# Patient Record
Sex: Female | Born: 1960 | Race: White | Hispanic: No | Marital: Married | Smoking: Never smoker
Health system: Southern US, Community
[De-identification: ages and names within clinical notes are randomized; demographics above are authoritative.]

## PROBLEM LIST (undated history)

## (undated) DIAGNOSIS — I10 Essential (primary) hypertension: Secondary | ICD-10-CM

## (undated) DIAGNOSIS — Z9889 Other specified postprocedural states: Secondary | ICD-10-CM

## (undated) DIAGNOSIS — R7303 Prediabetes: Secondary | ICD-10-CM

## (undated) DIAGNOSIS — G4730 Sleep apnea, unspecified: Secondary | ICD-10-CM

## (undated) HISTORY — PX: ABDOMINAL HYSTERECTOMY: SHX81

## (undated) HISTORY — PX: COMBINED ABDOMINOPLASTY AND LIPOSUCTION: SUR284

## (undated) HISTORY — PX: BREAST EXCISIONAL BIOPSY: SUR124

## (undated) HISTORY — PX: SHOULDER SURGERY: SHX246

## (undated) HISTORY — PX: BREAST ENHANCEMENT SURGERY: SHX7

## (undated) HISTORY — PX: ANKLE SURGERY: SHX546

---

## 2000-03-01 ENCOUNTER — Emergency Department (HOSPITAL_COMMUNITY): Admission: EM | Admit: 2000-03-01 | Discharge: 2000-03-01 | Payer: Self-pay

## 2004-12-08 ENCOUNTER — Other Ambulatory Visit: Admission: RE | Admit: 2004-12-08 | Discharge: 2004-12-08 | Payer: Self-pay | Admitting: *Deleted

## 2004-12-25 ENCOUNTER — Encounter: Admission: RE | Admit: 2004-12-25 | Discharge: 2004-12-25 | Payer: Self-pay | Admitting: Family Medicine

## 2005-01-27 ENCOUNTER — Ambulatory Visit: Payer: Self-pay | Admitting: Oncology

## 2005-03-26 ENCOUNTER — Ambulatory Visit: Payer: Self-pay | Admitting: Oncology

## 2005-05-21 ENCOUNTER — Ambulatory Visit: Payer: Self-pay | Admitting: Oncology

## 2005-07-16 ENCOUNTER — Ambulatory Visit: Payer: Self-pay | Admitting: Oncology

## 2006-08-31 ENCOUNTER — Encounter: Admission: RE | Admit: 2006-08-31 | Discharge: 2006-08-31 | Payer: Self-pay | Admitting: Orthopedic Surgery

## 2007-08-10 ENCOUNTER — Encounter: Admission: RE | Admit: 2007-08-10 | Discharge: 2007-08-10 | Payer: Self-pay | Admitting: Family Medicine

## 2008-11-06 ENCOUNTER — Encounter: Admission: RE | Admit: 2008-11-06 | Discharge: 2008-11-06 | Payer: Self-pay | Admitting: Family Medicine

## 2008-11-12 ENCOUNTER — Encounter: Admission: RE | Admit: 2008-11-12 | Discharge: 2008-11-12 | Payer: Self-pay | Admitting: Family Medicine

## 2009-10-11 HISTORY — PX: AUGMENTATION MAMMAPLASTY: SUR837

## 2009-11-07 ENCOUNTER — Encounter: Admission: RE | Admit: 2009-11-07 | Discharge: 2009-11-07 | Payer: Self-pay | Admitting: Family Medicine

## 2010-01-15 ENCOUNTER — Encounter: Admission: RE | Admit: 2010-01-15 | Discharge: 2010-01-15 | Payer: Self-pay | Admitting: Family Medicine

## 2010-10-31 ENCOUNTER — Encounter: Payer: Self-pay | Admitting: Family Medicine

## 2010-10-31 ENCOUNTER — Other Ambulatory Visit: Payer: Self-pay | Admitting: Family Medicine

## 2010-10-31 DIAGNOSIS — Z1239 Encounter for other screening for malignant neoplasm of breast: Secondary | ICD-10-CM

## 2010-11-13 ENCOUNTER — Ambulatory Visit: Payer: Self-pay

## 2010-11-17 ENCOUNTER — Ambulatory Visit
Admission: RE | Admit: 2010-11-17 | Discharge: 2010-11-17 | Disposition: A | Discharge status: Home / Self Care | Payer: Commercial Indemnity | Source: Ambulatory Visit | Attending: Family Medicine | Admitting: Family Medicine

## 2010-11-17 DIAGNOSIS — Z1239 Encounter for other screening for malignant neoplasm of breast: Secondary | ICD-10-CM

## 2011-01-22 ENCOUNTER — Emergency Department (HOSPITAL_COMMUNITY)
Admission: EM | Admit: 2011-01-22 | Discharge: 2011-01-22 | Disposition: A | Discharge status: Home / Self Care | Payer: Commercial Indemnity | Attending: Podiatry | Admitting: Podiatry

## 2011-01-22 DIAGNOSIS — R21 Rash and other nonspecific skin eruption: Secondary | ICD-10-CM | POA: Insufficient documentation

## 2011-01-22 DIAGNOSIS — L2989 Other pruritus: Secondary | ICD-10-CM | POA: Insufficient documentation

## 2011-01-22 DIAGNOSIS — L298 Other pruritus: Secondary | ICD-10-CM | POA: Insufficient documentation

## 2011-01-22 DIAGNOSIS — E039 Hypothyroidism, unspecified: Secondary | ICD-10-CM | POA: Insufficient documentation

## 2011-01-22 DIAGNOSIS — T7840XA Allergy, unspecified, initial encounter: Secondary | ICD-10-CM | POA: Insufficient documentation

## 2011-01-22 DIAGNOSIS — I10 Essential (primary) hypertension: Secondary | ICD-10-CM | POA: Insufficient documentation

## 2011-04-09 ENCOUNTER — Emergency Department (HOSPITAL_COMMUNITY)
Admission: EM | Admit: 2011-04-09 | Discharge: 2011-04-09 | Disposition: A | Discharge status: Home / Self Care | Payer: Commercial Indemnity | Attending: Emergency Medicine | Admitting: Emergency Medicine

## 2011-04-09 DIAGNOSIS — E039 Hypothyroidism, unspecified: Secondary | ICD-10-CM | POA: Insufficient documentation

## 2011-04-09 DIAGNOSIS — IMO0001 Reserved for inherently not codable concepts without codable children: Secondary | ICD-10-CM | POA: Insufficient documentation

## 2011-04-09 LAB — URINALYSIS, ROUTINE W REFLEX MICROSCOPIC
Glucose, UA: NEGATIVE mg/dL
Leukocytes, UA: NEGATIVE
Nitrite: NEGATIVE
pH: 7.5 (ref 5.0–8.0)

## 2011-04-09 LAB — DIFFERENTIAL
Basophils Absolute: 0 10*3/uL (ref 0.0–0.1)
Eosinophils Absolute: 0.1 10*3/uL (ref 0.0–0.7)
Eosinophils Relative: 1 % (ref 0–5)
Lymphocytes Relative: 15 % (ref 12–46)
Lymphs Abs: 1.6 10*3/uL (ref 0.7–4.0)
Neutrophils Relative %: 78 % — ABNORMAL HIGH (ref 43–77)

## 2011-04-09 LAB — COMPREHENSIVE METABOLIC PANEL
ALT: 12 U/L (ref 0–35)
AST: 17 U/L (ref 0–37)
Albumin: 4 g/dL (ref 3.5–5.2)
Alkaline Phosphatase: 100 U/L (ref 39–117)
CO2: 26 mEq/L (ref 19–32)
Chloride: 98 mEq/L (ref 96–112)
Creatinine, Ser: 0.74 mg/dL (ref 0.50–1.10)
GFR calc non Af Amer: >60 mL/min (ref >60)
Potassium: 4 mEq/L (ref 3.5–5.1)
Total Bilirubin: 0.3 mg/dL (ref 0.3–1.2)

## 2011-04-09 LAB — CBC
MCV: 88.1 fL (ref 78.0–100.0)
Platelets: 355 10*3/uL (ref 150–400)
RBC: 4.21 MIL/uL (ref 3.87–5.11)
RDW: 12.6 % (ref 11.5–15.5)
WBC: 10.3 10*3/uL (ref 4.0–10.5)

## 2011-04-12 LAB — B. BURGDORFI ANTIBODIES: B burgdorferi Ab IgG+IgM: 0.2 {ISR}

## 2012-02-10 ENCOUNTER — Emergency Department (HOSPITAL_COMMUNITY)
Admission: EM | Admit: 2012-02-10 | Discharge: 2012-02-10 | Disposition: A | Discharge status: Home / Self Care | Payer: Commercial Indemnity | Attending: Emergency Medicine | Admitting: Emergency Medicine

## 2012-02-10 ENCOUNTER — Encounter (HOSPITAL_COMMUNITY): Payer: Self-pay | Admitting: *Deleted

## 2012-02-10 DIAGNOSIS — K529 Noninfective gastroenteritis and colitis, unspecified: Secondary | ICD-10-CM

## 2012-02-10 DIAGNOSIS — K5289 Other specified noninfective gastroenteritis and colitis: Secondary | ICD-10-CM | POA: Insufficient documentation

## 2012-02-10 LAB — COMPREHENSIVE METABOLIC PANEL
ALT: 14 U/L (ref 0–35)
AST: 17 U/L (ref 0–37)
Albumin: 4.3 g/dL (ref 3.5–5.2)
Alkaline Phosphatase: 98 U/L (ref 39–117)
BUN: 14 mg/dL (ref 6–23)
CO2: 24 mEq/L (ref 19–32)
Calcium: 9.7 mg/dL (ref 8.4–10.5)
Chloride: 99 mEq/L (ref 96–112)
Creatinine, Ser: 0.78 mg/dL (ref 0.50–1.10)
GFR calc Af Amer: >90 mL/min (ref >90)
GFR calc non Af Amer: >90 mL/min (ref >90)
Glucose, Bld: 118 mg/dL — ABNORMAL HIGH (ref 70–99)
Potassium: 3.9 mEq/L (ref 3.5–5.1)
Sodium: 136 mEq/L (ref 135–145)
Total Bilirubin: 0.3 mg/dL (ref 0.3–1.2)
Total Protein: 9 g/dL — ABNORMAL HIGH (ref 6.0–8.3)

## 2012-02-10 LAB — DIFFERENTIAL
Basophils Absolute: 0.1 10*3/uL (ref 0.0–0.1)
Basophils Relative: 1 % (ref 0–1)
Eosinophils Absolute: 0 10*3/uL (ref 0.0–0.7)
Eosinophils Relative: 0 % (ref 0–5)
Lymphocytes Relative: 24 % (ref 12–46)
Lymphs Abs: 2.9 10*3/uL (ref 0.7–4.0)
Monocytes Absolute: 0.7 10*3/uL (ref 0.1–1.0)
Monocytes Relative: 6 % (ref 3–12)
Neutro Abs: 8.6 10*3/uL — ABNORMAL HIGH (ref 1.7–7.7)
Neutrophils Relative %: 70 % (ref 43–77)

## 2012-02-10 LAB — URINALYSIS, ROUTINE W REFLEX MICROSCOPIC
Bilirubin Urine: NEGATIVE
Glucose, UA: NEGATIVE mg/dL
Hgb urine dipstick: NEGATIVE
Ketones, ur: NEGATIVE mg/dL
Leukocytes, UA: NEGATIVE
Nitrite: NEGATIVE
Protein, ur: NEGATIVE mg/dL
Specific Gravity, Urine: 1.026 (ref 1.005–1.030)
Urobilinogen, UA: 0.2 mg/dL (ref 0.0–1.0)
pH: 6 (ref 5.0–8.0)

## 2012-02-10 LAB — CBC
HCT: 41.6 % (ref 36.0–46.0)
Hemoglobin: 14 g/dL (ref 12.0–15.0)
MCH: 30.4 pg (ref 26.0–34.0)
MCHC: 33.7 g/dL (ref 30.0–36.0)
MCV: 90.2 fL (ref 78.0–100.0)
Platelets: 432 10*3/uL — ABNORMAL HIGH (ref 150–400)
RBC: 4.61 MIL/uL (ref 3.87–5.11)
RDW: 12.6 % (ref 11.5–15.5)
WBC: 12.3 10*3/uL — ABNORMAL HIGH (ref 4.0–10.5)

## 2012-02-10 LAB — LIPASE, BLOOD: Lipase: 67 U/L — ABNORMAL HIGH (ref 11–59)

## 2012-02-10 MED ORDER — LOPERAMIDE HCL 2 MG PO CAPS: 2.0000 mg | ORAL_CAPSULE | Freq: Four times a day (QID) | ORAL | Status: AC | PRN

## 2012-02-10 MED ORDER — ONDANSETRON HCL 4 MG/2ML IJ SOLN
4.0000 mg | Freq: Once | INTRAMUSCULAR | Status: AC
  Administered 2012-02-10: 4 mg via INTRAVENOUS
  Filled 2012-02-10: qty 2

## 2012-02-10 MED ORDER — LOPERAMIDE HCL 2 MG PO CAPS
4.0000 mg | ORAL_CAPSULE | Freq: Once | ORAL | Status: AC
  Administered 2012-02-10: 4 mg via ORAL
  Filled 2012-02-10: qty 2

## 2012-02-10 MED ORDER — SODIUM CHLORIDE 0.9 % IV BOLUS (SEPSIS)
1000.0000 mL | Freq: Once | INTRAVENOUS | Status: AC
  Administered 2012-02-10: 1000 mL via INTRAVENOUS

## 2012-02-10 MED ORDER — PROMETHAZINE HCL 25 MG PO TABS: 25.0000 mg | ORAL_TABLET | Freq: Four times a day (QID) | ORAL | Status: DC | PRN

## 2012-02-10 MED ORDER — SODIUM CHLORIDE 0.9 % IV SOLN
Freq: Once | INTRAVENOUS | Status: AC
  Administered 2012-02-10: 14:00:00 via INTRAVENOUS

## 2012-02-10 NOTE — ED Notes (Signed)
Chris, PA at bedside. 

## 2012-02-10 NOTE — ED Notes (Signed)
Pt states decrease in nausea.

## 2012-02-10 NOTE — Discharge Instructions (Signed)
Return here as needed for any worsening in your condition. Slowly increase your fluids. There was a very mild elevation of an enzyme called Lipase that is dealing with your pancreas but this was mild and at this time seems not to be the cause of your issue. It could be mild irritation of the pancreas contributing to this or a result of the GI illness. I would suggest a bland diet over the next 24-36 hours.

## 2012-02-10 NOTE — ED Notes (Signed)
Pt reports nausea, vomiting, diarrhea and lower central abdominal pain. Nausea started Monday afternoon and symptoms became progressively worse.  Pt reports she is unable to keep fluids down.

## 2012-02-10 NOTE — ED Provider Notes (Signed)
History     CSN: [id number]  Arrival date & time 02/10/12  1205   First MD Initiated Contact with Patient 02/10/12 1223      Chief Complaint  Patient presents with  . Nausea  . Emesis  . Diarrhea  . Abdominal Pain    (Consider location/radiation/quality/duration/timing/severity/associated sxs/prior treatment) HPI The patient presents to the ER with a 4 day history of N/V/D. The patient states that she has some cramping pain right before her diarrhea BM happens. The patient does not have pain in her abdomen otherwise. The patient denies CP, SOB, fever, cough, dizziness, back pain, dysuria, or altered mental status. The patient states that she has not been able to eat and drink since this started Monday night. She states that nothing seems to improve the symptoms.  History reviewed. No pertinent past medical history.  Past Surgical History  Procedure Date  . Abdominal hysterectomy   . Breast enhancement surgery   . Combined abdominoplasty and liposuction     No family history on file.  History  Substance Use Topics  . Smoking status: Never Smoker   . Smokeless tobacco: Not on file  . Alcohol Use: Yes    OB History    Grav Para Term Preterm Abortions TAB SAB Ect Mult Living                  Review of Systems All other systems negative except as documented in the HPI. All pertinent positives and negatives as reviewed in the HPI.  Allergies  Sulfa antibiotics  Home Medications   Current Outpatient Rx  Name Route Sig Dispense Refill  . VITAMIN D 1000 UNITS PO TABS Oral Take 2,000 Units by mouth daily. Pt takes 2 tabs of the 1000 mg    . MULTI-VITAMIN/MINERALS PO TABS Oral Take 1 tablet by mouth daily.    . VENLAFAXINE HCL ER 150 MG PO CP24 Oral Take 150 mg by mouth daily.      BP 132/72  Pulse 78  Temp(Src) 98 F (36.7 C) (Oral)  Resp 20  Ht 5\' 3"  (1.6 m)  Wt 165 lb (74.844 kg)  BMI 29.23 kg/m2  SpO2 99%  Physical Exam Physical Examination: General  appearance - alert, well appearing, and in no distress, oriented to person, place, and time and normal appearing weight Mental status - alert, oriented to person, place, and time, normal mood, behavior, speech, dress, motor activity, and thought processes Eyes - pupils equal and reactive, extraocular eye movements intact Ears - bilateral TM's and external ear canals normal Nose - normal and patent, no erythema, discharge or polyps Mouth - normal other than mucous membranes do appear somewhat dry. Lymphatics - no palpable lymphadenopathy, no hepatosplenomegaly Chest - clear to auscultation, no wheezes, rales or rhonchi, symmetric air entry, no tachypnea, retractions or cyanosis Heart - normal rate, regular rhythm, normal S1, S2, no murmurs, rubs, clicks or gallops Abdomen - soft, nontender, nondistended, no masses or organomegaly Skin - normal coloration and turgor, no rashes, no suspicious skin lesions noted  ED Course  Procedures (including critical care time)  Labs Reviewed  COMPREHENSIVE METABOLIC PANEL - Abnormal; Notable for the following:    Glucose, Bld 118 (*)    Total Protein 9.0 (*)    All other components within normal limits  CBC - Abnormal; Notable for the following:    WBC 12.3 (*)    Platelets 432 (*)    All other components within normal limits  DIFFERENTIAL - Abnormal;  Notable for the following:    Neutro Abs 8.6 (*)    All other components within normal limits  URINALYSIS, ROUTINE W REFLEX MICROSCOPIC - Abnormal; Notable for the following:    APPearance CLOUDY (*)    All other components within normal limits  LIPASE, BLOOD - Abnormal; Notable for the following:    Lipase 67 (*)    All other components within normal limits   Patient is rechecked x3 while here in the ER. The patient has a very mild elevation of her LIpase. She does not have abdominal pain. The patient is advised to follow up with her PCP for further eval. She has been able to tolerate oral fluids  here in the ER. I still believe that the patient has gastroenteritis. The patient is not a drinker. The patient is advised to return here as needed for any worsening in her condition. The patient is advised to follow a BRAT diet. With the absence of abdominal pain no imaging was ordered. The patient is given the results and plan. She voiced an understanding.    MDM  MDM Reviewed: nursing note and vitals Reviewed previous: labs Interpretation: labs            Carlyle Dolly, PA-C 02/10/12 1829

## 2012-02-12 NOTE — ED Provider Notes (Signed)
Medical screening examination/treatment/procedure(s) were performed by non-physician practitioner and as supervising physician I was immediately available for consultation/collaboration.    Malekai Markwood R Seichi Kaufhold, MD 02/12/12 1319 

## 2012-03-08 ENCOUNTER — Other Ambulatory Visit: Payer: Self-pay | Admitting: Family Medicine

## 2012-03-08 DIAGNOSIS — Z9882 Breast implant status: Secondary | ICD-10-CM

## 2012-03-08 DIAGNOSIS — Z1231 Encounter for screening mammogram for malignant neoplasm of breast: Secondary | ICD-10-CM

## 2012-03-23 ENCOUNTER — Encounter

## 2012-04-07 ENCOUNTER — Ambulatory Visit
Admission: RE | Admit: 2012-04-07 | Discharge: 2012-04-07 | Disposition: A | Discharge status: Home / Self Care | Payer: 59 | Source: Ambulatory Visit | Attending: Family Medicine | Admitting: Family Medicine

## 2012-04-07 DIAGNOSIS — Z9882 Breast implant status: Secondary | ICD-10-CM

## 2012-04-07 DIAGNOSIS — Z1231 Encounter for screening mammogram for malignant neoplasm of breast: Secondary | ICD-10-CM

## 2012-11-18 ENCOUNTER — Encounter (HOSPITAL_COMMUNITY): Payer: Self-pay | Admitting: Emergency Medicine

## 2012-11-18 ENCOUNTER — Emergency Department (HOSPITAL_COMMUNITY)
Admission: EM | Admit: 2012-11-18 | Discharge: 2012-11-18 | Disposition: A | Discharge status: Home / Self Care | Payer: 59 | Attending: Emergency Medicine | Admitting: Emergency Medicine

## 2012-11-18 DIAGNOSIS — N390 Urinary tract infection, site not specified: Secondary | ICD-10-CM | POA: Insufficient documentation

## 2012-11-18 DIAGNOSIS — R3915 Urgency of urination: Secondary | ICD-10-CM | POA: Insufficient documentation

## 2012-11-18 DIAGNOSIS — M545 Low back pain, unspecified: Secondary | ICD-10-CM | POA: Insufficient documentation

## 2012-11-18 DIAGNOSIS — Z79899 Other long term (current) drug therapy: Secondary | ICD-10-CM | POA: Insufficient documentation

## 2012-11-18 DIAGNOSIS — R319 Hematuria, unspecified: Secondary | ICD-10-CM | POA: Insufficient documentation

## 2012-11-18 LAB — URINALYSIS, ROUTINE W REFLEX MICROSCOPIC
Ketones, ur: NEGATIVE mg/dL
Nitrite: POSITIVE — AB
Specific Gravity, Urine: 1.015 (ref 1.005–1.030)
pH: 5 (ref 5.0–8.0)

## 2012-11-18 LAB — URINE MICROSCOPIC-ADD ON

## 2012-11-18 MED ORDER — NITROFURANTOIN MONOHYD MACRO 100 MG PO CAPS: 100.0000 mg | ORAL_CAPSULE | Freq: Two times a day (BID) | ORAL | Status: DC

## 2012-11-18 NOTE — ED Notes (Signed)
Pt c/o bilat low back pain onset 1 week, pt noted today increase in urination denies pain with urination, pt noted pink streaks after urination today. Denies fever.

## 2012-11-18 NOTE — ED Provider Notes (Signed)
History     This chart was scribed for non-physician practitioner working with Gerhard Munch, MD by Burman Nieves. This patient was seen in room WTR9/WTR9 and the patient's care was started at 9:27 PM.  CSN: [id number]  Arrival date & time 11/18/12  1842      Chief Complaint  Patient presents with  . Urinary Frequency    (Consider location/radiation/quality/duration/timing/severity/associated sxs/prior treatment) Patient is a 52 y.o. female presenting with frequency. The history is provided by the patient. No language interpreter was used.  Urinary Frequency This is a new problem. The current episode started more than 2 days ago. The problem occurs constantly. The problem has not changed since onset.Pertinent negatives include no abdominal pain and no shortness of breath. Nothing aggravates the symptoms. Nothing relieves the symptoms. The treatment provided no relief.   LANYAH SPENGLER is a 52 y.o. female who presents to the Emergency Department complaining of constant urinary frequency and urinary urgency  onset two days ago. She complained of a small amount of blood in her urine onset two days ago.  She has been having pain across her lower back for the past week.  She began having that pain after moving.  She denies dysuria. Pt took two ibuprofen for pain with no relief. Pt denies of fever, chills, cough, nausea, vomiting, diarrhea, SOB, weakness, and any other associated symptoms.  No prior history of kidney stones.     History reviewed. No pertinent past medical history.  Past Surgical History  Procedure Laterality Date  . Abdominal hysterectomy    . Breast enhancement surgery    . Combined abdominoplasty and liposuction      No family history on file.  History  Substance Use Topics  . Smoking status: Never Smoker   . Smokeless tobacco: Not on file  . Alcohol Use: Yes     Comment: occasional    OB History   Grav Para Term Preterm Abortions TAB SAB Ect Mult Living                   Review of Systems  Constitutional: Negative for fever and chills.  Respiratory: Negative for shortness of breath.   Gastrointestinal: Negative for nausea, vomiting, abdominal pain and abdominal distention.  Genitourinary: Positive for urgency, frequency and hematuria. Negative for dysuria, flank pain, vaginal bleeding, vaginal discharge and difficulty urinating.  Neurological: Negative for weakness.  All other systems reviewed and are negative.    Allergies  Sulfa antibiotics  Home Medications   Current Outpatient Rx  Name  Route  Sig  Dispense  Refill  . cholecalciferol (VITAMIN D) 1000 UNITS tablet   Oral   Take 2,000 Units by mouth every morning.          Marland Kitchen estradiol (VIVELLE-DOT) 0.1 MG/24HR   Transdermal   Place 1 patch onto the skin 2 (two) times a week. Replace patch on Wednesday and Sunday         . LEVOTHYROXINE SODIUM PO   Oral   Take 1 tablet by mouth every morning.         . Multiple Vitamins-Minerals (MULTIVITAMIN WITH MINERALS) tablet   Oral   Take 1 tablet by mouth every morning.          . venlafaxine XR (EFFEXOR-XR) 75 MG 24 hr capsule   Oral   Take 75 mg by mouth every morning.           BP 134/65  Pulse 98  Temp(Src) 98 F (  36.7 C) (Oral)  Resp 16  Ht 5\' 3"  (1.6 m)  Wt 168 lb (76.204 kg)  BMI 29.77 kg/m2  SpO2 100%  Physical Exam  Nursing note and vitals reviewed. Constitutional: She is oriented to person, place, and time. She appears well-developed and well-nourished. No distress.  HENT:  Head: Normocephalic and atraumatic.  Mouth/Throat: Oropharynx is clear and moist.  Eyes: EOM are normal. Pupils are equal, round, and reactive to light.  Neck: Normal range of motion. Neck supple. No tracheal deviation present.  Cardiovascular: Normal rate, regular rhythm and normal heart sounds.   Pulmonary/Chest: Effort normal and breath sounds normal. No respiratory distress.  Abdominal: Soft. Bowel sounds are normal. She  exhibits no distension and no mass. There is no tenderness. There is no rebound, no guarding and no CVA tenderness.  Musculoskeletal: Normal range of motion.  Neurological: She is alert and oriented to person, place, and time.  Skin: Skin is warm and dry.  Psychiatric: She has a normal mood and affect. Her behavior is normal.    ED Course  Procedures (including critical care time) DIAGNOSTIC STUDIES: Oxygen Saturation is 100% on room air, normal by my interpretation.    COORDINATION OF CARE:  9:28 PM Discussed ED treatment with pt and pt agrees.       Labs Reviewed  URINALYSIS, ROUTINE W REFLEX MICROSCOPIC - Abnormal; Notable for the following:    Color, Urine AMBER (*)    APPearance CLOUDY (*)    Hgb urine dipstick LARGE (*)    Protein, ur 30 (*)    Nitrite POSITIVE (*)    Leukocytes, UA MODERATE (*)    All other components within normal limits  URINE MICROSCOPIC-ADD ON - Abnormal; Notable for the following:    Bacteria, UA MANY (*)    All other components within normal limits  URINE CULTURE   No results found.   No diagnosis found.    MDM  Patient presenting with increased urinary frequency and urgency.  No fevers.  No CVA tenderness.  No nausea or vomiting.  No evidence of Pyelonephritis.  Patient treated with antibiotic.  Urine sent for culture.  Return precautions discussed.  I personally performed the services described in this documentation, which was scribed in my presence. The recorded information has been reviewed and is accurate.    Pascal Lux [place], PA-C 11/18/12 2351

## 2012-11-19 NOTE — ED Provider Notes (Signed)
  Medical screening examination/treatment/procedure(s) were performed by non-physician practitioner and as supervising physician I was immediately available for consultation/collaboration.    Gerhard Munch, MD 11/19/12 0000

## 2012-11-20 LAB — URINE CULTURE

## 2012-11-21 NOTE — ED Notes (Signed)
+   Urine Patient treated with macrobid sensitive to same-Chart sent to EDP office for review.

## 2013-05-18 ENCOUNTER — Other Ambulatory Visit: Payer: Self-pay

## 2013-05-30 ENCOUNTER — Other Ambulatory Visit: Payer: Self-pay | Admitting: Family Medicine

## 2013-05-30 DIAGNOSIS — N6310 Unspecified lump in the right breast, unspecified quadrant: Secondary | ICD-10-CM

## 2013-06-13 ENCOUNTER — Ambulatory Visit
Admission: RE | Admit: 2013-06-13 | Discharge: 2013-06-13 | Disposition: A | Discharge status: Home / Self Care | Payer: 59 | Source: Ambulatory Visit | Attending: Family Medicine | Admitting: Family Medicine

## 2013-06-13 DIAGNOSIS — N6310 Unspecified lump in the right breast, unspecified quadrant: Secondary | ICD-10-CM

## 2013-06-18 ENCOUNTER — Other Ambulatory Visit: Payer: 59

## 2014-08-08 ENCOUNTER — Other Ambulatory Visit: Payer: Self-pay

## 2014-08-08 DIAGNOSIS — Z1231 Encounter for screening mammogram for malignant neoplasm of breast: Secondary | ICD-10-CM

## 2014-08-08 DIAGNOSIS — Z9882 Breast implant status: Secondary | ICD-10-CM

## 2014-09-03 ENCOUNTER — Ambulatory Visit
Admission: RE | Admit: 2014-09-03 | Discharge: 2014-09-03 | Disposition: A | Discharge status: Home / Self Care | Payer: PRIVATE HEALTH INSURANCE | Source: Ambulatory Visit

## 2014-09-03 DIAGNOSIS — Z9882 Breast implant status: Secondary | ICD-10-CM

## 2014-09-03 DIAGNOSIS — Z1231 Encounter for screening mammogram for malignant neoplasm of breast: Secondary | ICD-10-CM

## 2015-01-30 ENCOUNTER — Other Ambulatory Visit: Payer: Self-pay | Admitting: Internal Medicine

## 2015-01-30 DIAGNOSIS — E041 Nontoxic single thyroid nodule: Secondary | ICD-10-CM

## 2015-02-12 ENCOUNTER — Ambulatory Visit
Admission: RE | Admit: 2015-02-12 | Discharge: 2015-02-12 | Disposition: A | Discharge status: Home / Self Care | Payer: PRIVATE HEALTH INSURANCE | Source: Ambulatory Visit | Attending: Internal Medicine | Admitting: Internal Medicine

## 2015-02-12 DIAGNOSIS — E041 Nontoxic single thyroid nodule: Secondary | ICD-10-CM

## 2015-09-17 ENCOUNTER — Other Ambulatory Visit: Payer: Self-pay

## 2015-09-17 DIAGNOSIS — Z1231 Encounter for screening mammogram for malignant neoplasm of breast: Secondary | ICD-10-CM

## 2015-09-25 ENCOUNTER — Ambulatory Visit
Admission: RE | Admit: 2015-09-25 | Discharge: 2015-09-25 | Disposition: A | Discharge status: Home / Self Care | Payer: PRIVATE HEALTH INSURANCE | Source: Ambulatory Visit

## 2015-09-25 DIAGNOSIS — Z1231 Encounter for screening mammogram for malignant neoplasm of breast: Secondary | ICD-10-CM

## 2015-10-23 ENCOUNTER — Other Ambulatory Visit: Payer: Self-pay | Admitting: Nurse Practitioner

## 2015-10-23 ENCOUNTER — Ambulatory Visit
Admission: RE | Admit: 2015-10-23 | Discharge: 2015-10-23 | Disposition: A | Discharge status: Home / Self Care | Payer: PRIVATE HEALTH INSURANCE | Source: Ambulatory Visit | Attending: Nurse Practitioner | Admitting: Nurse Practitioner

## 2015-10-23 DIAGNOSIS — H6500 Acute serous otitis media, unspecified ear: Secondary | ICD-10-CM

## 2015-12-19 ENCOUNTER — Other Ambulatory Visit: Payer: Self-pay | Admitting: Internal Medicine

## 2015-12-19 DIAGNOSIS — E041 Nontoxic single thyroid nodule: Secondary | ICD-10-CM

## 2015-12-26 ENCOUNTER — Ambulatory Visit
Admission: RE | Admit: 2015-12-26 | Discharge: 2015-12-26 | Disposition: A | Discharge status: Home / Self Care | Payer: PRIVATE HEALTH INSURANCE | Source: Ambulatory Visit | Attending: Internal Medicine | Admitting: Internal Medicine

## 2015-12-26 DIAGNOSIS — E041 Nontoxic single thyroid nodule: Secondary | ICD-10-CM

## 2016-05-16 IMAGING — CR DG SINUSES 1-2V
2 series · 2 of 2 positions shown · non-contrast
Comparison: None.

CLINICAL DATA: Otitis.

EXAM:
PARANASAL SINUSES - 1-2 VIEW

[w waters]
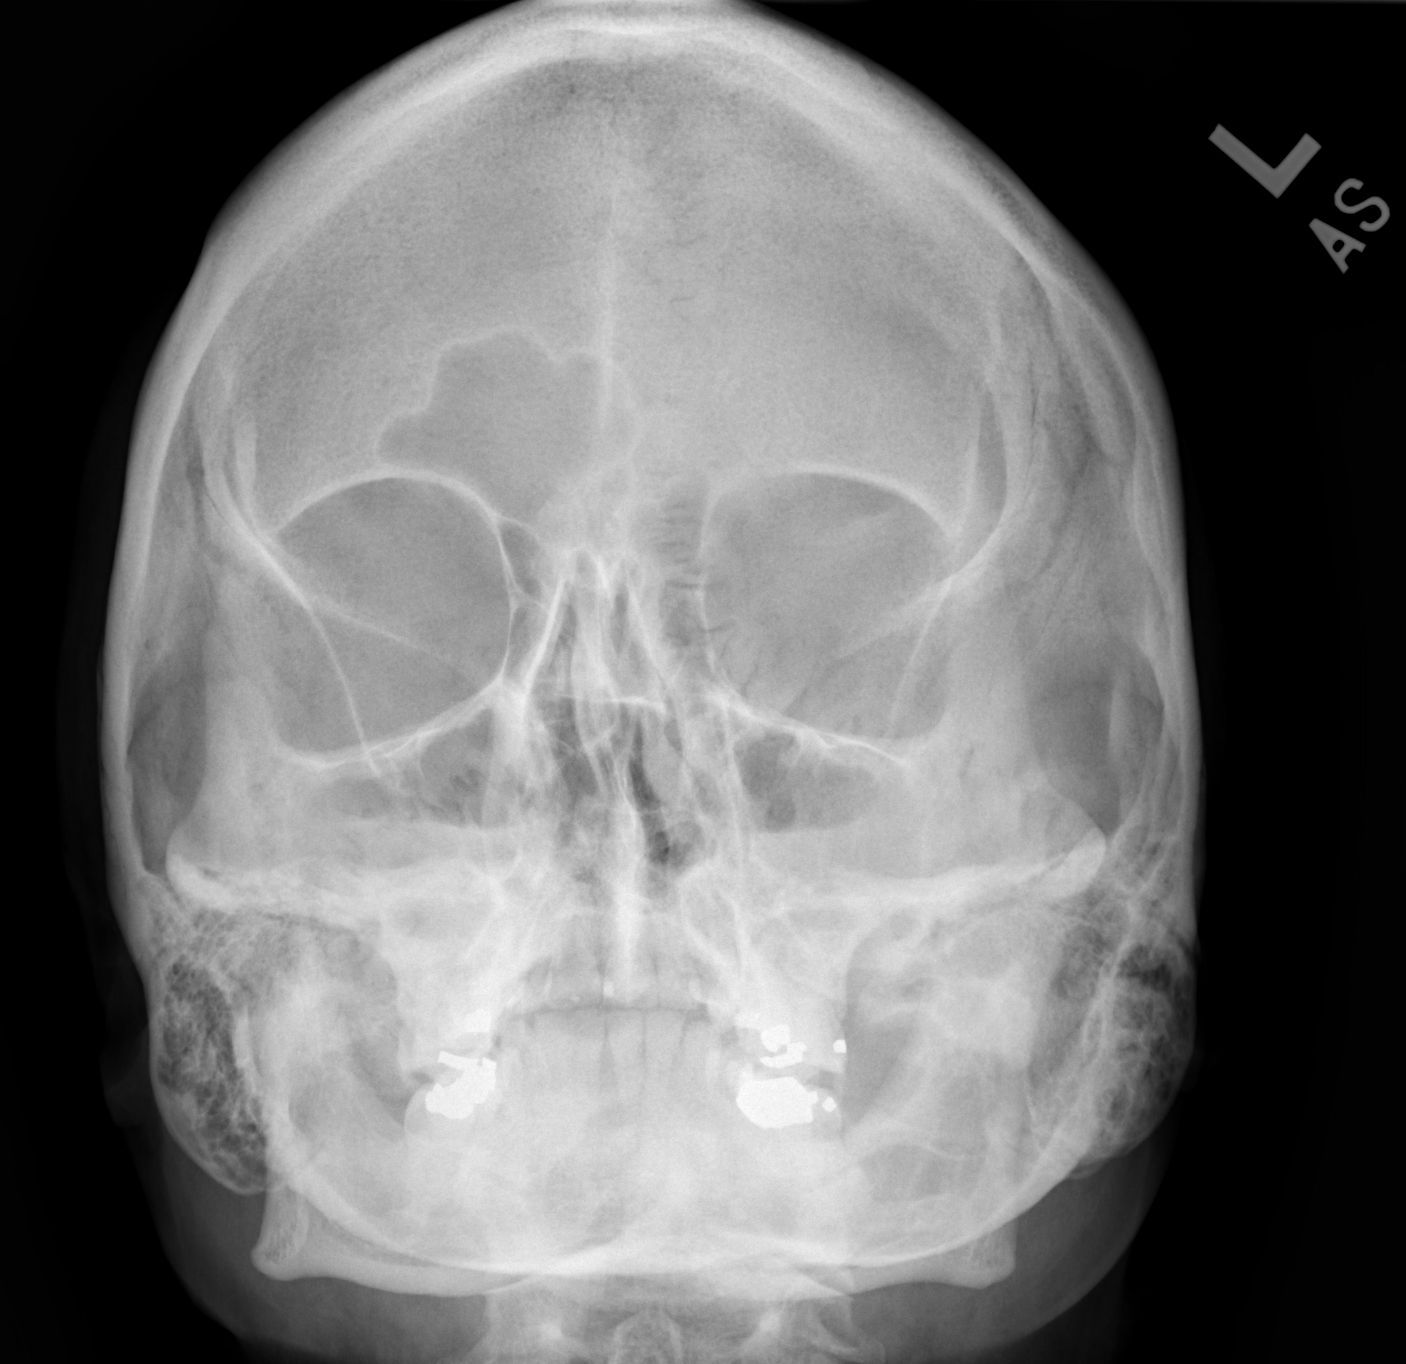

[w skull lat]
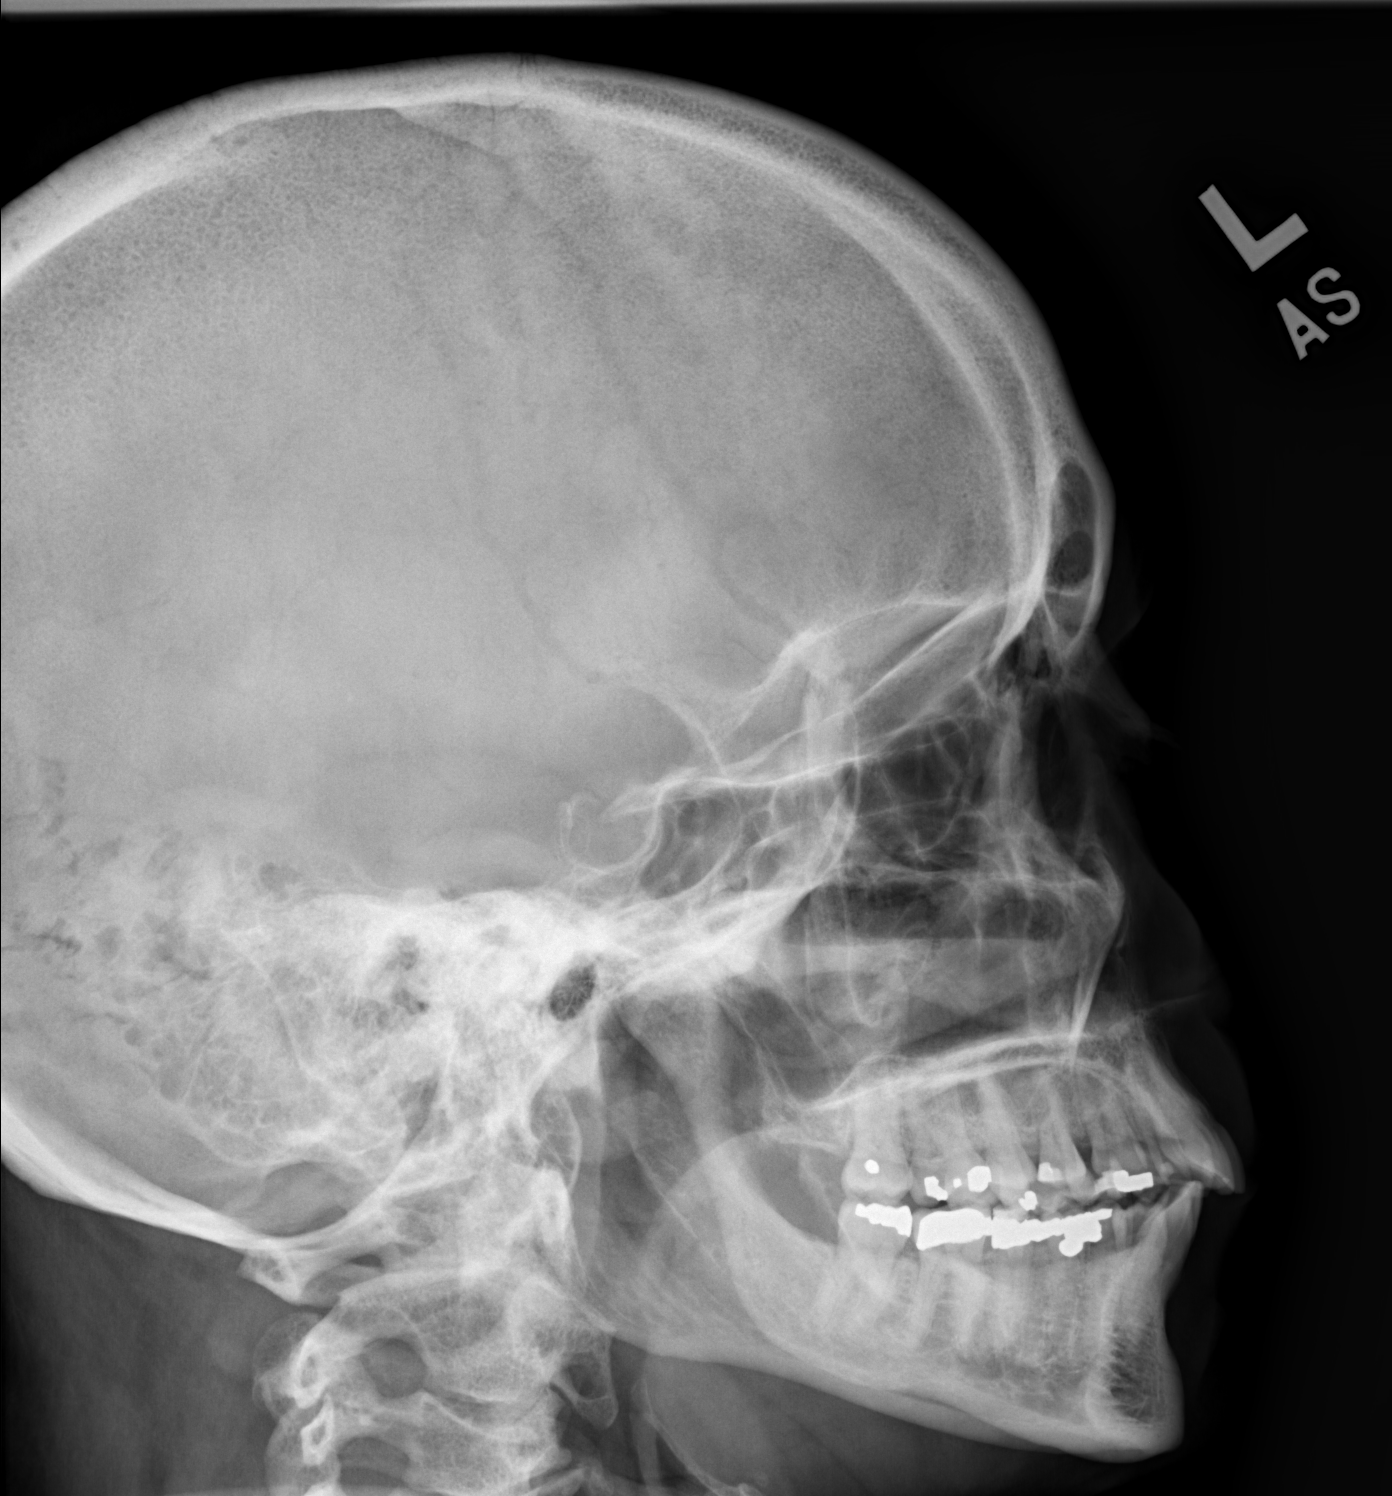

[2 of 2 positions shown; findings below may reference images not displayed]

FINDINGS: Air-fluid levels noted in both maxillary sinuses consistent with
acute sinusitis. No acute bony abnormality identified. Mastoids are
clear.
IMPRESSION: Acute bilateral maxillary sinusitis.

## 2016-11-12 ENCOUNTER — Other Ambulatory Visit: Payer: Self-pay | Admitting: Internal Medicine

## 2016-11-12 DIAGNOSIS — Z1231 Encounter for screening mammogram for malignant neoplasm of breast: Secondary | ICD-10-CM

## 2016-11-25 ENCOUNTER — Ambulatory Visit
Admission: RE | Admit: 2016-11-25 | Discharge: 2016-11-25 | Disposition: A | Discharge status: Home / Self Care | Payer: PRIVATE HEALTH INSURANCE | Source: Ambulatory Visit | Attending: Internal Medicine | Admitting: Internal Medicine

## 2016-11-25 DIAGNOSIS — Z1231 Encounter for screening mammogram for malignant neoplasm of breast: Secondary | ICD-10-CM

## 2017-10-31 ENCOUNTER — Other Ambulatory Visit: Payer: Self-pay | Admitting: Internal Medicine

## 2017-10-31 DIAGNOSIS — Z1231 Encounter for screening mammogram for malignant neoplasm of breast: Secondary | ICD-10-CM

## 2017-11-22 ENCOUNTER — Encounter (HOSPITAL_COMMUNITY): Payer: Self-pay

## 2017-11-22 ENCOUNTER — Ambulatory Visit (HOSPITAL_COMMUNITY): Payer: 59 | Attending: Student

## 2017-11-22 DIAGNOSIS — M25671 Stiffness of right ankle, not elsewhere classified: Secondary | ICD-10-CM | POA: Diagnosis present

## 2017-11-22 DIAGNOSIS — R29898 Other symptoms and signs involving the musculoskeletal system: Secondary | ICD-10-CM | POA: Insufficient documentation

## 2017-11-22 DIAGNOSIS — M25571 Pain in right ankle and joints of right foot: Secondary | ICD-10-CM | POA: Diagnosis present

## 2017-11-22 NOTE — Patient Instructions (Signed)
  ANKLE ABC's   While in a seated position, write out the alphabet in the air with your big toe.  Your ankle should be moving as you perform this.  Perform 1-2x/day, 2-3 rounds through   Gastrocnemius Stretch Off Step  Standing with the Simones of your foot on a step or a stoop with your leg and knee straight, allow your heel to slowly lower down off the step until you feel a stretch on the back of your calf. Hold this stretch for 30 seconds.  Perform 1-2x/day, 3-5 stretches holding for 30-60 seconds 

## 2017-11-22 NOTE — Therapy (Signed)
[facility] [address] [place], [place], [address] Phone: [phone]   Fax:  [phone]  Physical Therapy Evaluation  Patient Details  Name: Ann Walters MRN: [id number] Date of Birth: [birth year] Referring Provider: Jacinta Shoe, [place]   Encounter Date: 11/22/2017  PT End of Session - 11/22/17 1040    Visit Number  1    Number of Visits  13    Date for PT Re-Evaluation  12/13/17    Authorization Type  [employer] Open Access    Authorization Time Period  11/22/17 to 01/03/18    PT Start Time  0953    PT Stop Time  1035    PT Time Calculation (min)  42 min    Activity Tolerance  Patient tolerated treatment well    Behavior During Therapy  [facility] for tasks assessed/performed       History reviewed. No pertinent past medical history.  Past Surgical History:  Procedure Laterality Date  . ABDOMINAL HYSTERECTOMY    . AUGMENTATION MAMMAPLASTY Bilateral 2011  . BREAST ENHANCEMENT SURGERY    . COMBINED ABDOMINOPLASTY AND LIPOSUCTION      There were no vitals filed for this visit.   Subjective Assessment - 11/22/17 0955    Subjective  Pt states that she injured her R ankle about 3-18months ago; she's not sure what happened but states that she may have stepped in a hole or falling off a ladder. She has been wearing an ASO but states that it has not really helped. She felt a "deep pop" last week while walking. She said that when she's walking, something pulling at her medial ankle. She states that climbing ladders, pulling anything with weight with walking (walks backwards to pull things at work).     Limitations  Walking;Lifting    How long can you sit comfortably?  no issues    How long can you stand comfortably?  couple hours    How long can you walk comfortably?  35 mins    Patient Stated Goals  figure out what's going on with it    Currently in Pain?  Yes    Pain Score  1     Pain Location  Ankle    Pain Orientation  Right     Pain Descriptors / Indicators  Aching;Dull    Pain Type  Chronic pain    Pain Onset  More than a month ago    Pain Frequency  Constant    Aggravating Factors   WB activities, climbing ladders    Pain Relieving Factors  nothing    Effect of Pain on Daily Activities  increases         [facility] PT Assessment - 11/22/17 0001      Assessment   Medical Diagnosis  Posterior tibialis tendonitis    Referring Provider  Jacinta Shoe, PA-C    Onset Date/Surgical Date  07/22/17    Next MD Visit  2 months    Prior Therapy  yes for outer ankle      Precautions   Precautions  None      Balance Screen   Has the patient fallen in the past 6 months  Yes    How many times?  1 fell off ladder    Has the patient had a decrease in activity level because of a fear of falling?   No    Is the patient reluctant to leave their home because of a fear  of falling?   No      Prior Function   Level of Independence  Independent    Vocation  Full time employment    Vocation Requirements  night shift Production designer, theatre/television/film at [employer], hunt, gardening      Observation/Other Assessments   Focus on Therapeutic Outcomes (FOTO)   to be completed next visit      Functional Tests   Functional tests  Step down      Step Down   Comments  single leg step down from 7" step -- on LLE: min knee valgus and decreased control; on RLE: increased unsteadiness, min knee valgus, increased foot ER and painful      ROM / Strength   AROM / PROM / Strength  AROM;Strength      AROM   AROM Assessment Site  Ankle    Right/Left Ankle  Right;Left    Right Ankle Dorsiflexion  -5 pain    Right Ankle Plantar Flexion  50 pain    Right Ankle Inversion  14 pain    Right Ankle Eversion  10    Left Ankle Dorsiflexion  -1    Left Ankle Plantar Flexion  65    Left Ankle Inversion  19    Left Ankle Eversion  20      Strength   Strength Assessment Site  Hip;Knee;Ankle    Right Hip Flexion  5/5    Right Hip Extension  4+/5     Right Hip ABduction  4/5    Left Hip Flexion  5/5    Left Hip Extension  4+/5    Left Hip ABduction  4/5    Right/Left Knee  Right;Left    Right Knee Flexion  5/5    Right Knee Extension  5/5    Left Knee Flexion  5/5    Left Knee Extension  5/5    Right/Left Ankle  Right;Left    Right Ankle Dorsiflexion  5/5 pain    Right Ankle Plantar Flexion  5/5 pain, 20/20 single leg heel raises    Right Ankle Inversion  5/5 pain    Right Ankle Eversion  5/5    Left Ankle Dorsiflexion  5/5    Left Ankle Plantar Flexion  5/5 20/20 single leg heel raises    Left Ankle Inversion  5/5    Left Ankle Eversion  5/5      Palpation   Palpation comment  mild soft tissue restrictions but increased tenderness to palpation along muscle belly and tendon of R posterior tib, lateral gastroc/soleus      Ambulation/Gait   Ambulation/Gait  Yes    Ambulation Distance (Feet)  722 Feet    Assistive device  None    Gait Pattern  Step-through pattern;Trendelenburg R foot ER > than L; slight trendelenberg R>L      Balance   Balance Assessed  Yes      Static Standing Balance   Static Standing - Balance Support  No upper extremity supported    Static Standing Balance -  Activities   Single Leg Stance - Right Leg;Single Leg Stance - Left Leg    Static Standing - Comment/# of Minutes  R: 19 sec (increased ankle strategy for balance) L: 30sec          Objective measurements completed on examination: See above findings.        PT Education - 11/22/17 1040    Education provided  Yes  Education Details  exam findings, POC, HEP    Person(s) Educated  Patient    Methods  Explanation;Demonstration;Handout    Comprehension  Verbalized understanding;Returned demonstration       PT Short Term Goals - 11/22/17 1048      PT SHORT TERM GOAL #1   Title  Pt will be independent with HEP and perform consistently in order to decrease pain and maximize return to PLOF.    Time  3    Period  Weeks    Status   New    Target Date  12/13/17      PT SHORT TERM GOAL #2   Title  Pt will have improved R ankle ROM by 5 deg throughout to decrease pain and maximize gait and other functional tasks.    Time  3    Period  Weeks    Status  New      PT SHORT TERM GOAL #3   Title  Pt will be able to perform R SLS for 30 sec or > to be symmetrical with the LLE in order to maximize gait on uneven ground.    Time  3    Period  Weeks    Status  New        PT Long Term Goals - 11/22/17 1049      PT LONG TERM GOAL #1   Title  Pt will have improved bil ankle DF ROM to 5 deg and R PF ROM to 65 deg in order to decrease pain and maximize ability to complete functional tasks with greater ease.     Time  6    Period  Weeks    Status  New    Target Date  01/03/18      PT LONG TERM GOAL #2   Title  Pt will have improved bil hip MMT to 5/5 and have no pain with ankle MMT to demo improved overall strength and maximize function at work.    Time  6    Period  Weeks    Status  New      PT LONG TERM GOAL #3   Title  Pt will report being able to climb ladders at work with 2/10 R ankle pain or < to demo improved ROM and functional strength of her R ankle.    Time  6    Period  Weeks    Status  New      PT LONG TERM GOAL #4   Title  Pt will be able to perform R single leg step downs from 7" step 5/5 times with no evidence of unsteadiness, proper mechanics, and no pain to demo improved ankle ROM and strength in order to maximize stair ambulation and funciton at work.    Time  6    Period  Weeks    Status  New             Plan - 11/22/17 1044    Clinical Impression Statement  Pt is pleasant 56YO F who presents to OPPT with c/o R medial ankle pain. She presents with deficits in R ankle ROM, proximal hip strength, SLS, gait, and functional tasks such as stairs and single leg step downs. Pt also tender to palpation along posterior tib muscle and reported that it recreated her same pain. Pt is presenting with  s/s consistent with posterior tib tendonitis. She needs skilled PT intervention to address these deficits in order to maximize return to PLOF and decrease  pain.    History and Personal Factors relevant to plan of care:  h/o R ankle injury; otherwise relatively healthy individual with no significant medical history; motivated to participate, pain has been steadily improving thus far    Clinical Presentation  Stable    Clinical Presentation due to:  ROM, MMT, SLS, 3MWT, single leg step down, stairs, functional strength, soft tissue restrictions    Clinical Decision Making  Low    Rehab Potential  Good    PT Frequency  2x / week    PT Duration  6 weeks    PT Treatment/Interventions  ADLs/Self Care Home Management;Cryotherapy;Electrical Stimulation;Moist Heat;Traction;Ultrasound;Gait training;Stair training;Functional mobility training;Therapeutic activities;Therapeutic exercise;Balance training;Neuromuscular re-education;Patient/family education;Manual techniques;Scar mobilization;Compression bandaging;Passive range of motion;Dry needling;Energy conservation;Taping;Splinting    PT Next Visit Plan  review goals; do FOTO; ankle mobility such as BAPS, stretching, knee drives on step, etc.; towel scrunches, functional strengthening, eccentric PF and eccentric inversion; balance/dynamic stability, manual for soft tissue restrictions and pain control    PT Home Exercise Plan  eval: ABCs, standing calf stretch    Consulted and Agree with Plan of Care  Patient       Patient will benefit from skilled therapeutic intervention in order to improve the following deficits and impairments:  Abnormal gait, Decreased balance, Decreased range of motion, Decreased strength, Difficulty walking, Hypomobility, Increased fascial restricitons, Increased muscle spasms, Impaired flexibility, Improper body mechanics, Postural dysfunction, Pain  Visit Diagnosis: Pain in right ankle and joints of right foot - Plan: PT plan of  care cert/re-cert  Stiffness of right ankle, not elsewhere classified - Plan: PT plan of care cert/re-cert  Other symptoms and signs involving the musculoskeletal system - Plan: PT plan of care cert/re-cert     Problem List There are no active problems to display for this patient.      Jac CanavanBrooke Dailey Buccheri PT, DPT   [facility] [address] [place], [place], [address] Phone: [phone]   Fax:  [phone]  Name: Ann Walters MRN: [id number] Date of Birth: 02-Dec-1960

## 2017-11-24 ENCOUNTER — Ambulatory Visit (HOSPITAL_COMMUNITY): Payer: 59

## 2017-11-24 ENCOUNTER — Encounter (HOSPITAL_COMMUNITY): Payer: Self-pay

## 2017-11-24 DIAGNOSIS — M25571 Pain in right ankle and joints of right foot: Secondary | ICD-10-CM

## 2017-11-24 DIAGNOSIS — R29898 Other symptoms and signs involving the musculoskeletal system: Secondary | ICD-10-CM

## 2017-11-24 DIAGNOSIS — M25671 Stiffness of right ankle, not elsewhere classified: Secondary | ICD-10-CM

## 2017-11-24 NOTE — Therapy (Signed)
[place] [facility] [address] [place], [place], [address] Phone: [phone]   Fax:  [phone]  Physical Therapy Treatment  Patient Details  Name: Ann Walters MRN: [id number] Date of Birth: 1961/09/09 Referring Provider: Jacinta Shoe, [place]   Encounter Date: 11/24/2017  PT End of Session - 11/24/17 1043    Visit Number  2    Number of Visits  13    Date for PT Re-Evaluation  12/13/17    Authorization Type  [employer] Open Access    Authorization Time Period  11/22/17 to 01/03/18    PT Start Time  0954    PT Stop Time  1032    PT Time Calculation (min)  38 min    Activity Tolerance  Patient tolerated treatment well    Behavior During Therapy  [facility] for tasks assessed/performed       History reviewed. No pertinent past medical history.  Past Surgical History:  Procedure Laterality Date  . ABDOMINAL HYSTERECTOMY    . AUGMENTATION MAMMAPLASTY Bilateral 2011  . BREAST ENHANCEMENT SURGERY    . COMBINED ABDOMINOPLASTY AND LIPOSUCTION      There were no vitals filed for this visit.  Subjective Assessment - 11/24/17 1013    Subjective  pt reports she is hurting quite a bit today because she was on her feet on concrete floor at work a lot yesterday evening.     Limitations  Walking;Lifting    How long can you sit comfortably?  no issues    How long can you stand comfortably?  couple hours    How long can you walk comfortably?  35 mins    Patient Stated Goals  figure out what's going on with it    Currently in Pain?  Yes    Pain Score  4     Pain Location  Ankle    Pain Orientation  Right;Medial    Pain Descriptors / Indicators  Aching;Dull    Pain Type  Chronic pain    Pain Onset  --    Pain Frequency  Constant         [facility] PT Assessment - 11/24/17 0001      Observation/Other Assessments   Focus on Therapeutic Outcomes (FOTO)   54.3                  [facility] Adult PT Treatment/Exercise - 11/24/17 0001      Exercises   Exercises  Ankle      Ankle Exercises: Standing   Heel Raises  10 reps    Other Standing Ankle Exercises  slantboard 30 sec x3      Ankle Exercises: Seated   BAPS  Sitting;Level 1;10 reps A/P, M/L, CW/CCW          Balance Exercises - 11/24/17 1020      Balance Exercises: Standing   SLS  Eyes open;3 reps;30 secs;Solid surface;Foam/compliant surface      Balance Exercises: Seated   Other Seated Exercises  --        PT Education - 11/24/17 1042    Education provided  Yes    Education Details  Initial eval STG and LTGs, purpose of FOTO    Person(s) Educated  Patient    Methods  Explanation       PT Short Term Goals - 11/22/17 1048      PT SHORT TERM GOAL #1   Title  Pt will be independent with HEP and perform  consistently in order to decrease pain and maximize return to PLOF.    Time  3    Period  Weeks    Status  New    Target Date  12/13/17      PT SHORT TERM GOAL #2   Title  Pt will have improved R ankle ROM by 5 deg throughout to decrease pain and maximize gait and other functional tasks.    Time  3    Period  Weeks    Status  New      PT SHORT TERM GOAL #3   Title  Pt will be able to perform R SLS for 30 sec or > to be symmetrical with the LLE in order to maximize gait on uneven ground.    Time  3    Period  Weeks    Status  New        PT Long Term Goals - 11/22/17 1049      PT LONG TERM GOAL #1   Title  Pt will have improved bil ankle DF ROM to 5 deg and R PF ROM to 65 deg in order to decrease pain and maximize ability to complete functional tasks with greater ease.     Time  6    Period  Weeks    Status  New    Target Date  01/03/18      PT LONG TERM GOAL #2   Title  Pt will have improved bil hip MMT to 5/5 and have no pain with ankle MMT to demo improved overall strength and maximize function at work.    Time  6    Period  Weeks    Status  New      PT LONG TERM GOAL #3   Title  Pt will report being able to climb ladders at work  with 2/10 R ankle pain or < to demo improved ROM and functional strength of her R ankle.    Time  6    Period  Weeks    Status  New      PT LONG TERM GOAL #4   Title  Pt will be able to perform R single leg step downs from 7" step 5/5 times with no evidence of unsteadiness, proper mechanics, and no pain to demo improved ankle ROM and strength in order to maximize stair ambulation and funciton at work.    Time  6    Period  Weeks    Status  New            Plan - 11/24/17 1043    Clinical Impression Statement  Pt tolerated treatment well with complaints of popping on right ankle during a few of the exercises and patient stated that felt good. Patient c/o right hip and right medial ankle muscle fatigue during ther ex. Patient continues to exhibit deficits in pain, strength, and functional abilities especially with increases in pain when standing or walking on hard surfaces. Patient will continue to benefit from skilled physical therapy to improved these deficits.    Rehab Potential  Good    PT Frequency  2x / week    PT Duration  6 weeks    PT Treatment/Interventions  ADLs/Self Care Home Management;Cryotherapy;Electrical Stimulation;Moist Heat;Traction;Ultrasound;Gait training;Stair training;Functional mobility training;Therapeutic activities;Therapeutic exercise;Balance training;Neuromuscular re-education;Patient/family education;Manual techniques;Scar mobilization;Compression bandaging;Passive range of motion;Dry needling;Energy conservation;Taping;Splinting    PT Next Visit Plan   ankle mobility such as BAPS possibly in standing, stretching, knee drives on step, etc.; towel  scrunches, functional strengthening, eccentric PF and eccentric inversion; balance/dynamic stability, manual for soft tissue restrictions and pain control    PT Home Exercise Plan  eval: ABCs, standing calf stretch; 11/24/2017 - foam complaint RT SLS    Consulted and Agree with Plan of Care  Patient       Patient  will benefit from skilled therapeutic intervention in order to improve the following deficits and impairments:  Abnormal gait, Decreased balance, Decreased range of motion, Decreased strength, Difficulty walking, Hypomobility, Increased fascial restricitons, Increased muscle spasms, Impaired flexibility, Improper body mechanics, Postural dysfunction, Pain  Visit Diagnosis: Pain in right ankle and joints of right foot  Stiffness of right ankle, not elsewhere classified  Other symptoms and signs involving the musculoskeletal system     Problem List There are no active problems to display for this patient.   Katina Dung. Hartnett-Rands, MS, PT [id number] 11/24/2017, 10:56 AM  [facility] [address] [place], [place], [address] Phone: [phone]   Fax:  [phone]  Name: Ann Walters MRN: [id number] Date of Birth: 03-Nov-1960

## 2017-11-28 ENCOUNTER — Ambulatory Visit: Payer: PRIVATE HEALTH INSURANCE

## 2017-11-30 ENCOUNTER — Encounter (HOSPITAL_COMMUNITY): Payer: Self-pay

## 2017-11-30 ENCOUNTER — Ambulatory Visit (HOSPITAL_COMMUNITY): Payer: 59

## 2017-11-30 DIAGNOSIS — M25571 Pain in right ankle and joints of right foot: Secondary | ICD-10-CM

## 2017-11-30 DIAGNOSIS — R29898 Other symptoms and signs involving the musculoskeletal system: Secondary | ICD-10-CM

## 2017-11-30 DIAGNOSIS — M25671 Stiffness of right ankle, not elsewhere classified: Secondary | ICD-10-CM

## 2017-11-30 NOTE — Therapy (Signed)
[facility] [address] [place], [place], [address] Phone: [phone]   Fax:  [phone]  Physical Therapy Treatment  Patient Details  Name: Ann Walters MRN: [id number] Date of Birth: 02/25/1961 Referring Provider: Jacinta Shoe, [place]   Encounter Date: 11/30/2017  PT End of Session - 11/30/17 1004    Visit Number  3    Number of Visits  13    Date for PT Re-Evaluation  12/13/17    Authorization Type  [employer] Open Access    Authorization Time Period  11/22/17 to 01/03/18    PT Start Time  0950    PT Stop Time  1033    PT Time Calculation (min)  43 min    Activity Tolerance  Patient tolerated treatment well    Behavior During Therapy  [facility] for tasks assessed/performed       History reviewed. No pertinent past medical history.  Past Surgical History:  Procedure Laterality Date  . ABDOMINAL HYSTERECTOMY    . AUGMENTATION MAMMAPLASTY Bilateral 2011  . BREAST ENHANCEMENT SURGERY    . COMBINED ABDOMINOPLASTY AND LIPOSUCTION      There were no vitals filed for this visit.  Subjective Assessment - 11/30/17 0956    Subjective  Pt feels the bursea is swelling following therapy and work due to standing on concrete floor at work.  Current pain scale 2/10 today.      Patient Stated Goals  figure out what's going on with it    Currently in Pain?  Yes    Pain Score  2     Pain Location  Ankle    Pain Orientation  Medial    Pain Descriptors / Indicators  Aching sharp pain after standing 4 hours    Pain Type  Chronic pain    Pain Onset  More than a month ago    Pain Frequency  Constant    Aggravating Factors   WB activities, climbing ladders    Pain Relieving Factors  nothing    Effect of Pain on Daily Activities  increases                      [facility] Adult PT Treatment/Exercise - 11/30/17 0001      Manual Therapy   Manual Therapy  Joint mobilization;Other (comment)    Manual therapy comments  Manual  complete separate than rest of tx    Joint Mobilization  talus mobs during knee drives for DF    Other Manual Therapy  Ice massage to posterior tib and medial mallouses      Ankle Exercises: Seated   BAPS  Standing;Level 2;10 reps Df/PF; In/Ev; CW/CCW      Ankle Exercises: Supine   T-Band  GTB 10x slow all directopjs      Ankle Exercises: Stretches   Slant Board Stretch  3 reps;30 seconds    Other Stretch  Knee drives to improve DF 5x 10 with manual talus mobs      Ankle Exercises: Standing   Heel Raises  10 reps               PT Short Term Goals - 11/22/17 1048      PT SHORT TERM GOAL #1   Title  Pt will be independent with HEP and perform consistently in order to decrease pain and maximize return to PLOF.    Time  3    Period  Weeks    Status  New    Target Date  12/13/17      PT SHORT TERM GOAL #2   Title  Pt will have improved R ankle ROM by 5 deg throughout to decrease pain and maximize gait and other functional tasks.    Time  3    Period  Weeks    Status  New      PT SHORT TERM GOAL #3   Title  Pt will be able to perform R SLS for 30 sec or > to be symmetrical with the LLE in order to maximize gait on uneven ground.    Time  3    Period  Weeks    Status  New        PT Long Term Goals - 11/22/17 1049      PT LONG TERM GOAL #1   Title  Pt will have improved bil ankle DF ROM to 5 deg and R PF ROM to 65 deg in order to decrease pain and maximize ability to complete functional tasks with greater ease.     Time  6    Period  Weeks    Status  New    Target Date  01/03/18      PT LONG TERM GOAL #2   Title  Pt will have improved bil hip MMT to 5/5 and have no pain with ankle MMT to demo improved overall strength and maximize function at work.    Time  6    Period  Weeks    Status  New      PT LONG TERM GOAL #3   Title  Pt will report being able to climb ladders at work with 2/10 R ankle pain or < to demo improved ROM and functional strength of her R  ankle.    Time  6    Period  Weeks    Status  New      PT LONG TERM GOAL #4   Title  Pt will be able to perform R single leg step downs from 7" step 5/5 times with no evidence of unsteadiness, proper mechanics, and no pain to demo improved ankle ROM and strength in order to maximize stair ambulation and funciton at work.    Time  6    Period  Weeks    Status  New            Plan - 11/30/17 1045    Clinical Impression Statement  Session focus with ankle mobility and strengthening.  Pt stated the ankle popping is a relief.  Added manual talus mobs to improve mobility and dorsiflexion and good "pops" following.  Pt educated on benefits with ice massage to assist with edema control, reports of relief following.  Pt continues to report tightness/pain on posterior tib insertion point.  No reports of increased pain through session.    Rehab Potential  Good    PT Frequency  2x / week    PT Duration  6 weeks    PT Treatment/Interventions  ADLs/Self Care Home Management;Cryotherapy;Electrical Stimulation;Moist Heat;Traction;Ultrasound;Gait training;Stair training;Functional mobility training;Therapeutic activities;Therapeutic exercise;Balance training;Neuromuscular re-education;Patient/family education;Manual techniques;Scar mobilization;Compression bandaging;Passive range of motion;Dry needling;Energy conservation;Taping;Splinting    PT Next Visit Plan   ankle mobility such as BAPS possibly in standing, stretching, knee drives on step, etc.; towel scrunches, functional strengthening, eccentric PF and eccentric inversion; balance/dynamic stability, manual for soft tissue restrictions and pain control    PT Home Exercise Plan  eval: ABCs, standing calf stretch; 11/24/2017 - foam  complaint RT SLS       Patient will benefit from skilled therapeutic intervention in order to improve the following deficits and impairments:  Abnormal gait, Decreased balance, Decreased range of motion, Decreased strength,  Difficulty walking, Hypomobility, Increased fascial restricitons, Increased muscle spasms, Impaired flexibility, Improper body mechanics, Postural dysfunction, Pain  Visit Diagnosis: Pain in right ankle and joints of right foot  Stiffness of right ankle, not elsewhere classified  Other symptoms and signs involving the musculoskeletal system     Problem List There are no active problems to display for this patient.  [address] LPTA; CBIS [phone] Juel Burrow 11/30/2017, 10:56 AM  [facility] [address] [place], [place], [address] Phone: [phone]   Fax:  [phone]  Name: Ann Walters MRN: [id number] Date of Birth: 1961-08-06

## 2017-12-01 ENCOUNTER — Ambulatory Visit (HOSPITAL_COMMUNITY): Payer: 59

## 2017-12-01 ENCOUNTER — Encounter (HOSPITAL_COMMUNITY): Payer: Self-pay

## 2017-12-01 DIAGNOSIS — M25571 Pain in right ankle and joints of right foot: Secondary | ICD-10-CM

## 2017-12-01 DIAGNOSIS — R29898 Other symptoms and signs involving the musculoskeletal system: Secondary | ICD-10-CM

## 2017-12-01 DIAGNOSIS — M25671 Stiffness of right ankle, not elsewhere classified: Secondary | ICD-10-CM

## 2017-12-01 NOTE — Therapy (Signed)
[place] [facility] [address] [place], [place], [address] Phone: [phone]   Fax:  [phone]  Physical Therapy Treatment  Patient Details  Name: Ann Walters MRN: [id number] Date of Birth: 24-Sep-1961 Referring Provider: Jacinta Shoe, [place]   Encounter Date: 12/01/2017  PT End of Session - 12/01/17 1054    Visit Number  4    Number of Visits  13    Date for PT Re-Evaluation  12/13/17    Authorization Type  [employer] Open Access    Authorization Time Period  11/22/17 to 01/03/18    PT Start Time  1045 pt late for apt    PT Stop Time  1123    PT Time Calculation (min)  38 min    Activity Tolerance  Patient tolerated treatment well    Behavior During Therapy  [facility] for tasks assessed/performed       History reviewed. No pertinent past medical history.  Past Surgical History:  Procedure Laterality Date  . ABDOMINAL HYSTERECTOMY    . AUGMENTATION MAMMAPLASTY Bilateral 2011  . BREAST ENHANCEMENT SURGERY    . COMBINED ABDOMINOPLASTY AND LIPOSUCTION      There were no vitals filed for this visit.  Subjective Assessment - 12/01/17 1050    Subjective  Pt stated reduced swelling following therapy and increased good popping in metatarsal region, current pain scale 2/10.      Patient Stated Goals  figure out what's going on with it    Currently in Pain?  Yes    Pain Score  2     Pain Location  Ankle    Pain Orientation  Right    Pain Descriptors / Indicators  Sharp    Pain Type  Chronic pain    Pain Onset  More than a month ago    Pain Frequency  Constant    Aggravating Factors   WB activities, climbing ladders    Pain Relieving Factors  nothing    Effect of Pain on Daily Activities  increases                      [facility] Adult PT Treatment/Exercise - 12/01/17 0001      Manual Therapy   Manual Therapy  Joint mobilization;Soft tissue mobilization;Other (comment)    Manual therapy comments  Manual complete separate  than rest of tx    Joint Mobilization  talus mobs during knee drives for DF; prone calcaneal mobs    Soft tissue mobilization  Prone: gastroc/soleus/posterior tib    Other Manual Therapy  Ice massage to posterior tib and medial mallouses      Ankle Exercises: Standing   BAPS  Standing;Level 2;10 reps Df/PF; Inv/ EV; CW/CCW    Heel Raises  10 reps    Toe Raise  10 reps      Ankle Exercises: Stretches   Slant Board Stretch  3 reps;30 seconds    Other Stretch  Knee drives to improve DF 5x 10 with manual talus mobs               PT Short Term Goals - 11/22/17 1048      PT SHORT TERM GOAL #1   Title  Pt will be independent with HEP and perform consistently in order to decrease pain and maximize return to PLOF.    Time  3    Period  Weeks    Status  New    Target Date  12/13/17  PT SHORT TERM GOAL #2   Title  Pt will have improved R ankle ROM by 5 deg throughout to decrease pain and maximize gait and other functional tasks.    Time  3    Period  Weeks    Status  New      PT SHORT TERM GOAL #3   Title  Pt will be able to perform R SLS for 30 sec or > to be symmetrical with the LLE in order to maximize gait on uneven ground.    Time  3    Period  Weeks    Status  New        PT Long Term Goals - 11/22/17 1049      PT LONG TERM GOAL #1   Title  Pt will have improved bil ankle DF ROM to 5 deg and R PF ROM to 65 deg in order to decrease pain and maximize ability to complete functional tasks with greater ease.     Time  6    Period  Weeks    Status  New    Target Date  01/03/18      PT LONG TERM GOAL #2   Title  Pt will have improved bil hip MMT to 5/5 and have no pain with ankle MMT to demo improved overall strength and maximize function at work.    Time  6    Period  Weeks    Status  New      PT LONG TERM GOAL #3   Title  Pt will report being able to climb ladders at work with 2/10 R ankle pain or < to demo improved ROM and functional strength of her R ankle.     Time  6    Period  Weeks    Status  New      PT LONG TERM GOAL #4   Title  Pt will be able to perform R single leg step downs from 7" step 5/5 times with no evidence of unsteadiness, proper mechanics, and no pain to demo improved ankle ROM and strength in order to maximize stair ambulation and funciton at work.    Time  6    Period  Weeks    Status  New            Plan - 12/01/17 1100    Clinical Impression Statement  Pt late for apt, unable to complete full POC.  Continues session focus on ankle mobility and strengthening with manual for pain control.  Progress to standing BAPS for mobilty and heel/toe raises for gait strengthening.  EOS with manual ice massage and joint mobs to improve mobility for pain control.  Noted increased tightness gastroc/soleus complex, increase time with manual next session.    Rehab Potential  Good    PT Frequency  2x / week    PT Duration  6 weeks    PT Treatment/Interventions  ADLs/Self Care Home Management;Cryotherapy;Electrical Stimulation;Moist Heat;Traction;Ultrasound;Gait training;Stair training;Functional mobility training;Therapeutic activities;Therapeutic exercise;Balance training;Neuromuscular re-education;Patient/family education;Manual techniques;Scar mobilization;Compression bandaging;Passive range of motion;Dry needling;Energy conservation;Taping;Splinting    PT Next Visit Plan   ankle mobility such as BAPS possibly in standing, stretching, knee drives on step, etc.; towel scrunches, functional strengthening, eccentric PF and eccentric inversion; balance/dynamic stability, manual for soft tissue restrictions and pain control    PT Home Exercise Plan  eval: ABCs, standing calf stretch; 11/24/2017 - foam complaint RT SLS       Patient will benefit from skilled therapeutic intervention  in order to improve the following deficits and impairments:  Abnormal gait, Decreased balance, Decreased range of motion, Decreased strength, Difficulty walking,  Hypomobility, Increased fascial restricitons, Increased muscle spasms, Impaired flexibility, Improper body mechanics, Postural dysfunction, Pain  Visit Diagnosis: Pain in right ankle and joints of right foot  Stiffness of right ankle, not elsewhere classified  Other symptoms and signs involving the musculoskeletal system     Problem List There are no active problems to display for this patient.  [address] LPTA; CBIS [phone]  Juel Burrow 12/01/2017, 12:22 PM  [place] [address] [place], [place], [address] Phone: [phone]   Fax:  [phone]  Name: Ann Walters MRN: [id number] Date of Birth: 09-26-1961

## 2017-12-06 ENCOUNTER — Encounter (HOSPITAL_COMMUNITY): Payer: Self-pay

## 2017-12-06 ENCOUNTER — Ambulatory Visit (HOSPITAL_COMMUNITY): Payer: 59

## 2017-12-06 DIAGNOSIS — M25671 Stiffness of right ankle, not elsewhere classified: Secondary | ICD-10-CM

## 2017-12-06 DIAGNOSIS — R29898 Other symptoms and signs involving the musculoskeletal system: Secondary | ICD-10-CM

## 2017-12-06 DIAGNOSIS — M25571 Pain in right ankle and joints of right foot: Secondary | ICD-10-CM | POA: Diagnosis not present

## 2017-12-06 NOTE — Patient Instructions (Signed)
  Ankle Dorsiflexion Self-Mobilization  Secure a band or belt around a stable object. Place one foot into the band and position yourself facing away from the object with the loop of the band in the front of the ankle crease. Get into a half-kneeling position and create tension in the band. With the foot placed firmly on the ground drive the knee forward out over the foot.  Can do standing with foot on step stool with your grey band around right ankle  Perform 1x/day, 10-15 reps holding for 10-15 seconds    STANDING HEEL RAISES - SINGLE LEG  While standing on one leg, raise up on your toes as you lift your heel off the ground.  1x/day, Hold for 10-15 seconds on the right leg -- 10-15 reps,

## 2017-12-06 NOTE — Therapy (Signed)
[facility] [address] [place], [place], [address] Phone: [phone]   Fax:  [phone]  Physical Therapy Treatment  Patient Details  Name: Ann DurieMichelle D Goodell MRN: [id number] Date of Birth: 1960-11-05 Referring Provider: Jacinta ShoeJustin Pike O[place], [place]   Encounter Date: 12/06/2017  PT End of Session - 12/06/17 0951    Visit Number  5    Number of Visits  13    Date for PT Re-Evaluation  12/13/17    Authorization Type  [employer] Open Access    Authorization Time Period  11/22/17 to 01/03/18    PT Start Time  0946    PT Stop Time  1027    PT Time Calculation (min)  41 min    Activity Tolerance  Patient tolerated treatment well    Behavior During Therapy  [facility] for tasks assessed/performed       History reviewed. No pertinent past medical history.  Past Surgical History:  Procedure Laterality Date  . ABDOMINAL HYSTERECTOMY    . AUGMENTATION MAMMAPLASTY Bilateral 2011  . BREAST ENHANCEMENT SURGERY    . COMBINED ABDOMINOPLASTY AND LIPOSUCTION      There were no vitals filed for this visit.  Subjective Assessment - 12/06/17 0948    Subjective  Pt sates that she feels like her ankle is getting better. She is still having pain on that ankle bone when she walks on it. Her ankle is doing better at work since she got new shoes.     Patient Stated Goals  figure out what's going on with it    Currently in Pain?  Yes    Pain Score  1     Pain Location  Ankle    Pain Orientation  Right    Pain Descriptors / Indicators  Aching;Dull    Pain Type  Chronic pain    Pain Onset  More than a month ago    Pain Frequency  Constant    Aggravating Factors   WB activities, climbing ladders    Pain Relieving Factors  nothing    Effect of Pain on Daily Activities  increases           [facility] Adult PT Treatment/Exercise - 12/06/17 0001      Manual Therapy   Manual Therapy  Joint mobilization;Soft tissue mobilization;Other (comment)    Manual therapy  comments  Manual complete separate than rest of tx    Joint Mobilization  AP talocrural joint mobs for DF    Soft tissue mobilization  prone: STM, cross friction along posterior tib insertion, tendon, muscle belly      Ankle Exercises: Stretches   Slant Board Stretch  3 reps;30 seconds    Other Stretch  mobilization with movement on 8" step 10x10" holds; self-mobilization with movement on 8" step with BTB 5x10" holds      Ankle Exercises: Standing   Heel Raises  15 reps;Limitations    Heel Raises Limitations  RLE only; x15 eccentric PF on 7" step    Other Standing Ankle Exercises  R single leg PF isometrics 5x10" each           PT Education - 12/06/17 0950    Education provided  Yes    Education Details  exercise technique; updated HEP    Person(s) Educated  Patient    Methods  Explanation;Demonstration;Handout    Comprehension  Verbalized understanding;Returned demonstration       PT Short Term Goals - 11/22/17 1048  PT SHORT TERM GOAL #1   Title  Pt will be independent with HEP and perform consistently in order to decrease pain and maximize return to PLOF.    Time  3    Period  Weeks    Status  New    Target Date  12/13/17      PT SHORT TERM GOAL #2   Title  Pt will have improved R ankle ROM by 5 deg throughout to decrease pain and maximize gait and other functional tasks.    Time  3    Period  Weeks    Status  New      PT SHORT TERM GOAL #3   Title  Pt will be able to perform R SLS for 30 sec or > to be symmetrical with the LLE in order to maximize gait on uneven ground.    Time  3    Period  Weeks    Status  New        PT Long Term Goals - 11/22/17 1049      PT LONG TERM GOAL #1   Title  Pt will have improved bil ankle DF ROM to 5 deg and R PF ROM to 65 deg in order to decrease pain and maximize ability to complete functional tasks with greater ease.     Time  6    Period  Weeks    Status  New    Target Date  01/03/18      PT LONG TERM GOAL #2    Title  Pt will have improved bil hip MMT to 5/5 and have no pain with ankle MMT to demo improved overall strength and maximize function at work.    Time  6    Period  Weeks    Status  New      PT LONG TERM GOAL #3   Title  Pt will report being able to climb ladders at work with 2/10 R ankle pain or < to demo improved ROM and functional strength of her R ankle.    Time  6    Period  Weeks    Status  New      PT LONG TERM GOAL #4   Title  Pt will be able to perform R single leg step downs from 7" step 5/5 times with no evidence of unsteadiness, proper mechanics, and no pain to demo improved ankle ROM and strength in order to maximize stair ambulation and funciton at work.    Time  6    Period  Weeks    Status  New            Plan - 12/06/17 1030    Clinical Impression Statement  Pt presents to therapy with subjective reports of progress; she is still having pain with pulling heavy dolley's worth of equipment at work (essentially eccentric PF). Session today focused on strengthening, ROM, and pain management. Added isometric PF and knee drives + TB for self-ankle DF mobs for improved ROM and pain management. Ended session with manual to R posterior tib and ankle joint mobs. Pt reported feeling great at EOS. Continue as planned. Progressing as able.    Rehab Potential  Good    PT Frequency  2x / week    PT Duration  6 weeks    PT Treatment/Interventions  ADLs/Self Care Home Management;Cryotherapy;Electrical Stimulation;Moist Heat;Traction;Ultrasound;Gait training;Stair training;Functional mobility training;Therapeutic activities;Therapeutic exercise;Balance training;Neuromuscular re-education;Patient/family education;Manual techniques;Scar mobilization;Compression bandaging;Passive range of motion;Dry needling;Energy conservation;Taping;Splinting  PT Next Visit Plan  have pt pull sled to simulate work duties; trial kinesiotaping R ankle for pain management; ankle mobility such as BAPS  possibly in standing, stretching, knee drives on step, etc.; towel scrunches, functional strengthening, continue eccentric PF and eccentric inversion; balance/dynamic stability, manual for soft tissue restrictions and pain control    PT Home Exercise Plan  eval: ABCs, standing calf stretch; 11/24/2017 - foam complaint RT SLS; 2/26: knee drives on step + TB for self-ankle DF mob, isometric PF     Consulted and Agree with Plan of Care  Patient       Patient will benefit from skilled therapeutic intervention in order to improve the following deficits and impairments:  Abnormal gait, Decreased balance, Decreased range of motion, Decreased strength, Difficulty walking, Hypomobility, Increased fascial restricitons, Increased muscle spasms, Impaired flexibility, Improper body mechanics, Postural dysfunction, Pain  Visit Diagnosis: Pain in right ankle and joints of right foot  Stiffness of right ankle, not elsewhere classified  Other symptoms and signs involving the musculoskeletal system     Problem List There are no active problems to display for this patient.      Jac Canavan PT, DPT  [facility] [address] [place], [place], [address] Phone: [phone]   Fax:  [phone]  Name: MESSINA KOSINSKI MRN: [id number] Date of Birth: Jul 27, 1961

## 2017-12-08 ENCOUNTER — Ambulatory Visit (HOSPITAL_COMMUNITY): Payer: 59 | Admitting: Physical Therapy

## 2017-12-08 DIAGNOSIS — R29898 Other symptoms and signs involving the musculoskeletal system: Secondary | ICD-10-CM

## 2017-12-08 DIAGNOSIS — M25571 Pain in right ankle and joints of right foot: Secondary | ICD-10-CM

## 2017-12-08 DIAGNOSIS — M25671 Stiffness of right ankle, not elsewhere classified: Secondary | ICD-10-CM

## 2017-12-08 NOTE — Therapy (Signed)
[facility] [address] [place], [place], [address] Phone: [phone]   Fax:  [phone]  Physical Therapy Treatment  Patient Details  Name: Ann Walters MRN: [id number] Date of Birth: [birth year] Referring Provider: Jacinta Shoe, [place]   Encounter Date: 12/08/2017  PT End of Session - 12/08/17 1050    Visit Number  6    Number of Visits  13    Date for PT Re-Evaluation  12/13/17    Authorization Type  [employer] Open Access    Authorization Time Period  11/22/17 to 01/03/18    PT Start Time  0950    PT Stop Time  1028    PT Time Calculation (min)  38 min    Activity Tolerance  Patient tolerated treatment well    Behavior During Therapy  [facility] for tasks assessed/performed       No past medical history on file.  Past Surgical History:  Procedure Laterality Date  . ABDOMINAL HYSTERECTOMY    . AUGMENTATION MAMMAPLASTY Bilateral 2011  . BREAST ENHANCEMENT SURGERY    . COMBINED ABDOMINOPLASTY AND LIPOSUCTION      There were no vitals filed for this visit.  Subjective Assessment - 12/08/17 1119    Subjective  Pt states she is having some pain today in her ankle, especially at the bone (pointing to medial malleoli) with weight bearing.  Overall getting better but pain comes back.    Currently in Pain?  Yes    Pain Score  2     Pain Location  Ankle    Pain Orientation  Right    Pain Descriptors / Indicators  Aching;Sore    Pain Type  Chronic pain                      [facility] Adult PT Treatment/Exercise - 12/08/17 0001      Manual Therapy   Manual Therapy  Joint mobilization;Soft tissue mobilization;Other (comment);Taping    Manual therapy comments  Manual complete separate than rest of tx    Joint Mobilization  AP talocrural joint mobs for DF    Soft tissue mobilization  prone: STM, cross friction along posterior tib insertion, tendon, muscle belly    Kinesiotex  Inhibit Muscle  (Pended)  for posterior tib  tendonitis to decrease pain      Ankle Exercises: Stretches   Slant Board Stretch  3 reps;30 seconds      Ankle Exercises: Standing   Heel Raises  15 reps;Limitations    Heel Raises Limitations  RLE only; x15 eccentric PF on 7" step    Other Standing Ankle Exercises  R single leg PF isometrics 5x10" each             PT Education - 12/08/17 1126    Education provided  Yes    Education Details  educated on kinesiotape.  Pt given instructions to remove tape if pain increasees, rubs blisters or any other issues with wear. PT verbalized understanding.     Person(s) Educated  Patient    Methods  Explanation    Comprehension  Verbalized understanding       PT Short Term Goals - 11/22/17 1048      PT SHORT TERM GOAL #1   Title  Pt will be independent with HEP and perform consistently in order to decrease pain and maximize return to PLOF.    Time  3    Period  Weeks  Status  New    Target Date  12/13/17      PT SHORT TERM GOAL #2   Title  Pt will have improved R ankle ROM by 5 deg throughout to decrease pain and maximize gait and other functional tasks.    Time  3    Period  Weeks    Status  New      PT SHORT TERM GOAL #3   Title  Pt will be able to perform R SLS for 30 sec or > to be symmetrical with the LLE in order to maximize gait on uneven ground.    Time  3    Period  Weeks    Status  New        PT Long Term Goals - 11/22/17 1049      PT LONG TERM GOAL #1   Title  Pt will have improved bil ankle DF ROM to 5 deg and R PF ROM to 65 deg in order to decrease pain and maximize ability to complete functional tasks with greater ease.     Time  6    Period  Weeks    Status  New    Target Date  01/03/18      PT LONG TERM GOAL #2   Title  Pt will have improved bil hip MMT to 5/5 and have no pain with ankle MMT to demo improved overall strength and maximize function at work.    Time  6    Period  Weeks    Status  New      PT LONG TERM GOAL #3   Title  Pt will  report being able to climb ladders at work with 2/10 R ankle pain or < to demo improved ROM and functional strength of her R ankle.    Time  6    Period  Weeks    Status  New      PT LONG TERM GOAL #4   Title  Pt will be able to perform R single leg step downs from 7" step 5/5 times with no evidence of unsteadiness, proper mechanics, and no pain to demo improved ankle ROM and strength in order to maximize stair ambulation and funciton at work.    Time  6    Period  Weeks    Status  New            Plan - 12/08/17 1121    Clinical Impression Statement  Continued with strengthening and mobility to improve overall stability of ankle and reduce pain.  Began with therex and completed without incident other than cramping into calf with heelraises.  Completed manual with focus on friction massage to post tib tendon and joint mobes to increase mobility.  Applied kinesiotape to Rt ankle, posterior tendonitis method to help reduce pain as well.  Pt reported no pain or issues immediately following therapy and improved overall state of ankle with tape in place.  Pt given instructions to remove tape if pain increasees, rubs blisters or any other issues with wear. PT verbalized understanding.     Rehab Potential  Good    PT Frequency  2x / week    PT Duration  6 weeks    PT Treatment/Interventions  ADLs/Self Care Home Management;Cryotherapy;Electrical Stimulation;Moist Heat;Traction;Ultrasound;Gait training;Stair training;Functional mobility training;Therapeutic activities;Therapeutic exercise;Balance training;Neuromuscular re-education;Patient/family education;Manual techniques;Scar mobilization;Compression bandaging;Passive range of motion;Dry needling;Energy conservation;Taping;Splinting    PT Next Visit Plan  Assess effectiveness of kinesiotaping next session.   PRogress to  standing BAPS and have pt pull sled to simulate work duties.  Continue with manual for soft tissue restrictions and pain control.   Focus on eccentric inversion/PR and dynamic stabllity activites.      PT Home Exercise Plan  eval: ABCs, standing calf stretch; 11/24/2017 - foam complaint RT SLS; 2/26: knee drives on step + TB for self-ankle DF mob, isometric PF     Consulted and Agree with Plan of Care  Patient       Patient will benefit from skilled therapeutic intervention in order to improve the following deficits and impairments:  Abnormal gait, Decreased balance, Decreased range of motion, Decreased strength, Difficulty walking, Hypomobility, Increased fascial restricitons, Increased muscle spasms, Impaired flexibility, Improper body mechanics, Postural dysfunction, Pain  Visit Diagnosis: Pain in right ankle and joints of right foot  Stiffness of right ankle, not elsewhere classified  Other symptoms and signs involving the musculoskeletal system     Problem List There are no active problems to display for this patient.  Lurena NidaAmy B Frazier, PTA/CLT [phone]  Lurena NidaFrazier, Amy B 12/08/2017, 11:26 AM  [facility] [address] [place], [place], [address] Phone: [phone]   Fax:  [phone]  Name: Kemper DurieMichelle D Klostermann MRN: [id number] Date of Birth: [birth year]

## 2017-12-13 ENCOUNTER — Encounter (HOSPITAL_COMMUNITY): Payer: Self-pay

## 2017-12-13 ENCOUNTER — Ambulatory Visit (HOSPITAL_COMMUNITY): Payer: 59 | Attending: Student

## 2017-12-13 ENCOUNTER — Other Ambulatory Visit: Payer: Self-pay

## 2017-12-13 DIAGNOSIS — M25571 Pain in right ankle and joints of right foot: Secondary | ICD-10-CM | POA: Diagnosis not present

## 2017-12-13 DIAGNOSIS — M25671 Stiffness of right ankle, not elsewhere classified: Secondary | ICD-10-CM | POA: Insufficient documentation

## 2017-12-13 DIAGNOSIS — R29898 Other symptoms and signs involving the musculoskeletal system: Secondary | ICD-10-CM

## 2017-12-13 NOTE — Therapy (Signed)
[place], [place], [address] Phone: [phone]   Fax:  [phone]  Physical Therapy Treatment/Re-Assessment  Patient Details  Name: Ann Walters MRN: [id number] Date of Birth: 12/26/1960 Referring Provider: Corky Sing, [place]   Encounter Date: 12/13/2017  PT End of Session - 12/13/17 1046    Visit Number  7    Number of Visits  13    Date for PT Re-Evaluation  01/03/18    Authorization Type  [employer] Open Access    Authorization Time Period  11/22/17 to 01/03/18    PT Start Time  0951    PT Stop Time  1034    PT Time Calculation (min)  43 min    Activity Tolerance  Patient tolerated treatment well    Behavior During Therapy  [facility] for tasks assessed/performed       History reviewed. No pertinent past medical history.  Past Surgical History:  Procedure Laterality Date  . ABDOMINAL HYSTERECTOMY    . AUGMENTATION MAMMAPLASTY Bilateral 2011  . BREAST ENHANCEMENT SURGERY    . COMBINED ABDOMINOPLASTY AND LIPOSUCTION      There were no vitals filed for this visit.   Subjective Assessment - 12/13/17 0954    Subjective  Patient reports that her right foot has felt better with the tape and that she would like to learn how to apply it herself. She states she has been going up and down ladders at work and that it causes her around 5-6/10 pain typically but can get up to a 10/10 still. She states she is able to walk around work longer before her ankle starts hurting a lot and she does believe she is doing better than before. She states she has no follow up appointments scheduled right now.    Limitations  Walking;Lifting    How long can you sit comfortably?  no issues    How long can you stand comfortably?  4 hours    How long can you walk comfortably?  4 hours    Patient Stated Goals  figure out what's going on with it    Currently in Pain?  Yes    Pain Score  1     Pain Location  Ankle    Pain  Orientation  Right    Pain Descriptors / Indicators  Aching;Sore    Pain Type  Chronic pain    Pain Onset  More than a month ago    Pain Frequency  Constant    Aggravating Factors   walking, stairs, work activities    Pain Relieving Factors  rest       [facility] PT Assessment - 12/13/17 0001      Assessment   Medical Diagnosis  Posterior tibialis tendonitis    Referring Provider  Corky Sing, Pa-C    Onset Date/Surgical Date  07/22/17    Next MD Visit  none scheduled    Prior Therapy  yes for outer ankle      Observation/Other Assessments   Focus on Therapeutic Outcomes (FOTO)   -- 54.3 on 11/24/17      Functional Tests   Functional tests  Step down;Single leg stance      Step Down   Comments  Unable to perform on Rt LE due to pain in ankle and feeling of stiffness; perform 5 on Lt LE with minimal knee valgus which is imrpoved with singel UE fingertip support for balance.  Single Leg Stance   Comments  Bil LE = 30 seconds, eyes open      AROM   Right Ankle Dorsiflexion  3 was -5    Right Ankle Plantar Flexion  55 was 50    Right Ankle Inversion  25 was 14    Right Ankle Eversion  30 was 10    Left Ankle Dorsiflexion  3 was -1    Left Ankle Plantar Flexion  65    Left Ankle Inversion  25 was 19    Left Ankle Eversion  32 was 20      Strength   Right Hip Flexion  5/5    Right Hip Extension  5/5 was 4+/5    Right Hip ABduction  5/5 was 4/5    Left Hip Flexion  5/5    Left Hip Extension  5/5 was 4+/5    Left Hip ABduction  5/5 was 4/5    Right Knee Flexion  5/5    Right Knee Extension  5/5    Left Knee Flexion  5/5    Left Knee Extension  5/5    Right Ankle Dorsiflexion  5/5    Right Ankle Plantar Flexion  5/5    Right Ankle Inversion  5/5    Right Ankle Eversion  5/5    Left Ankle Dorsiflexion  5/5    Left Ankle Plantar Flexion  5/5    Left Ankle Inversion  5/5    Left Ankle Eversion  5/5        [facility] Adult PT Treatment/Exercise - 12/13/17 0001       Manual Therapy   Manual Therapy  Joint mobilization;Soft tissue mobilization;Other (comment);Taping    Manual therapy comments  Manual complete separate than rest of tx    Joint Mobilization  AP talocrural joint mobs for DF    Soft tissue mobilization  cross friction along posterior tib insertion    Kinesiotex  Inhibit Muscle for posterior tib tendonitis to decrease pain      Ankle Exercises: Standing   BAPS  Standing;Level 2;10 reps Clockwise/Counterclowise    Heel Raises  Limitations    Heel Raises Limitations  1x 25 with tennis Minich between heals to facilitate posterior tib activtaion       PT Education - 12/13/17 1044    Education provided  Yes    Education Details  Educated on progress towards goals and on importance of HEP. Educated on application of K tape and where to obtain tape.  Educated on exercise form throughout.     Person(s) Educated  Patient    Methods  Explanation    Comprehension  Verbalized understanding       PT Short Term Goals - 12/13/17 8657      PT SHORT TERM GOAL #1   Title  Pt will be independent with HEP and perform consistently in order to decrease pain and maximize return to PLOF.    Baseline  12/13/17 - do it at least 1x per day    Time  3    Period  Weeks    Status  Achieved      PT SHORT TERM GOAL #2   Title  Pt will have improved R ankle ROM by 5 deg throughout to decrease pain and maximize gait and other functional tasks.    Baseline  12/13/17 - improved by 5 degrees or more    Time  3    Period  Weeks    Status  Achieved      PT SHORT TERM GOAL #3   Title  Pt will be able to perform R SLS for 30 sec or > to be symmetrical with the LLE in order to maximize gait on uneven ground.    Baseline  12/13/17 - Bil LE 30 seconds    Time  3    Period  Weeks    Status  Achieved        PT Long Term Goals - 12/13/17 3716      PT LONG TERM GOAL #1   Title  Pt will have improved bil ankle DF ROM to 5 deg and R PF ROM to 65 deg in order to decrease  pain and maximize ability to complete functional tasks with greater ease.     Baseline  12/13/17 - Right DF = 3 degrees, Rigth PF = 55 degrees    Time  6    Period  Weeks    Status  On-going      PT LONG TERM GOAL #2   Title  Pt will have improved bil hip MMT to 5/5 and have no pain with ankle MMT to demo improved overall strength and maximize function at work.    Baseline  12/13/17 - all 5/5 throughout Bil LE    Time  6    Period  Weeks    Status  Achieved      PT LONG TERM GOAL #3   Title  Pt will report being able to climb ladders at work with 2/10 R ankle pain or < to demo improved ROM and functional strength of her R ankle.    Baseline  12/13/17 - Patient reports from 6-10/10     Time  6    Period  Weeks    Status  On-going      PT LONG TERM GOAL #4   Title  Pt will be able to perform R single leg step downs from 7" step 5/5 times with no evidence of unsteadiness, proper mechanics, and no pain to demo improved ankle ROM and strength in order to maximize stair ambulation and funciton at work.    Baseline  12/13/17 - unable to perform on Rt LE due to ankel pain/stiffness    Time  6    Period  Weeks    Status  On-going        Plan - 12/13/17 1105    Clinical Impression Statement  Re-assessment performed today and patient has met 3/3 short term goals and 1/4 long term goals. She has reported improved tolerance to activity at work and is able to ambulate for 4 hours efore her pain begins to increase. She has demonstrated significant improvement in ROM, strength, and single limb balance, but continues to be limited by eccentric muscle strength, and stiffness in her right ankle. She reproted decreased pain with the use of k-tape and was instructed on how to apply tape herself. She denied pain at the end of todays session. She will continues to benefit from skilled PT services to address ongoing impairments and improve functional mobility at work and during daily activities.     Rehab Potential   Good    PT Frequency  2x / week    PT Duration  6 weeks    PT Treatment/Interventions  ADLs/Self Care Home Management;Cryotherapy;Electrical Stimulation;Moist Heat;Traction;Ultrasound;Gait training;Stair training;Functional mobility training;Therapeutic activities;Therapeutic exercise;Balance training;Neuromuscular re-education;Patient/family education;Manual techniques;Scar mobilization;Compression bandaging;Passive range of motion;Dry needling;Energy conservation;Taping;Splinting    PT Next Visit Plan  Progress to standing BAPS to level 3 and have pt pull sled to simulate work duties.  Continue with manual for soft tissue restrictions and pain control.  Focus on eccentric inversion/PF and dynamic stabllity activites.      PT Home Exercise Plan  eval: ABCs, standing calf stretch; 11/24/2017 - foam complaint RT SLS; 2/26: knee drives on step + TB for self-ankle DF mob, isometric PF; 12/13/17 - K tap instruction, heel raises with Jacques;    Consulted and Agree with Plan of Care  Patient       Patient will benefit from skilled therapeutic intervention in order to improve the following deficits and impairments:  Abnormal gait, Decreased balance, Decreased range of motion, Decreased strength, Difficulty walking, Hypomobility, Increased fascial restricitons, Increased muscle spasms, Impaired flexibility, Improper body mechanics, Postural dysfunction, Pain  Visit Diagnosis: Pain in right ankle and joints of right foot  Stiffness of right ankle, not elsewhere classified  Other symptoms and signs involving the musculoskeletal system     Problem List There are no active problems to display for this patient.   Kipp Brood, PT, DPT Physical Therapist with [facility]  12/13/2017 11:12 AM    [place], [place], [address] Phone: [phone]   Fax:  [phone]  Name: MILANA SALAY MRN: [id number] Date  of Birth: 12/18/1960

## 2017-12-13 NOTE — Patient Instructions (Signed)
Taping: you can buy tape at any over the counter drug store or [employer] or order it online  Google: "posterior tib K tape"      Tennis Estis Raises: 2 time 20 repetitions  Place a tennis Raybuck between both heels and squeeze the Nichols.  Raise up on your toes.  Focus on even distribution of your weight on your feet and keeping the Speak between the heels without dropping it.   Begin going up and down stairs with one step at a time pattern.

## 2017-12-15 ENCOUNTER — Telehealth (HOSPITAL_COMMUNITY): Payer: Self-pay | Admitting: Physical Therapy

## 2017-12-15 NOTE — Telephone Encounter (Signed)
her co-worker is in the hospital and she has to work extra hours can not be here.

## 2017-12-16 ENCOUNTER — Ambulatory Visit (HOSPITAL_COMMUNITY): Payer: 59

## 2017-12-20 ENCOUNTER — Ambulatory Visit
Admission: RE | Admit: 2017-12-20 | Discharge: 2017-12-20 | Disposition: A | Discharge status: Home / Self Care | Payer: 59 | Source: Ambulatory Visit | Attending: Internal Medicine | Admitting: Internal Medicine

## 2017-12-20 ENCOUNTER — Encounter (HOSPITAL_COMMUNITY): Payer: 59

## 2017-12-20 DIAGNOSIS — Z1231 Encounter for screening mammogram for malignant neoplasm of breast: Secondary | ICD-10-CM

## 2017-12-22 ENCOUNTER — Ambulatory Visit (HOSPITAL_COMMUNITY): Payer: 59 | Admitting: Physical Therapy

## 2017-12-22 DIAGNOSIS — R29898 Other symptoms and signs involving the musculoskeletal system: Secondary | ICD-10-CM

## 2017-12-22 DIAGNOSIS — M25571 Pain in right ankle and joints of right foot: Secondary | ICD-10-CM | POA: Diagnosis not present

## 2017-12-22 DIAGNOSIS — M25671 Stiffness of right ankle, not elsewhere classified: Secondary | ICD-10-CM

## 2017-12-22 NOTE — Therapy (Signed)
[facility] [address] [place], [place], [address] Phone: [phone]   Fax:  [phone]  Physical Therapy Treatment  Patient Details  Name: Ann Walters MRN: [id number] Date of Birth: May 27, 1961 Referring Provider: Jacinta Shoe, [place]   Encounter Date: 12/22/2017    No past medical history on file.  Past Surgical History:  Procedure Laterality Date  . ABDOMINAL HYSTERECTOMY    . AUGMENTATION MAMMAPLASTY Bilateral 2011  . BREAST ENHANCEMENT SURGERY    . BREAST EXCISIONAL BIOPSY Right   . COMBINED ABDOMINOPLASTY AND LIPOSUCTION      There were no vitals filed for this visit.  Subjective Assessment - 12/22/17 1717    Subjective  Pt states her ankle is hurting worse today and further to the front of her medial malleoli after she tripped on her pajama leg several days ago.  States its hard to get or keep it comfortable at night.   States it continues to grind with certain motions and feels like something is stuck in it.    Also reports snapping at medial calf.  4/10     Currently in Pain?  Yes    Pain Score  4     Pain Location  Ankle    Pain Orientation  Right;Mid    Pain Descriptors / Indicators  Aching                      [facility] Adult PT Treatment/Exercise - 12/22/17 0001      Manual Therapy   Manual Therapy  Joint mobilization;Soft tissue mobilization;Other (comment);Taping    Manual therapy comments  Manual complete separate than rest of tx    Joint Mobilization  AP talocrural joint mobs for DF    Soft tissue mobilization  cross friction along posterior tib insertion    Kinesiotex  Inhibit Muscle      Ankle Exercises: Stretches   Slant Board Stretch  3 reps;30 seconds      Ankle Exercises: Standing   BAPS  Standing;Level 2;10 reps;Limitations    BAPS Limitations  A/P, Rt/Lt, CW    Heel Raises  Limitations;15 reps    Heel Raises Limitations  1x 25 with tennis Beel between heals to facilitate  posterior tib activtaion    Toe Raise  15 reps               PT Short Term Goals - 12/13/17 6962      PT SHORT TERM GOAL #1   Title  Pt will be independent with HEP and perform consistently in order to decrease pain and maximize return to PLOF.    Baseline  12/13/17 - do it at least 1x per day    Time  3    Period  Weeks    Status  Achieved      PT SHORT TERM GOAL #2   Title  Pt will have improved R ankle ROM by 5 deg throughout to decrease pain and maximize gait and other functional tasks.    Baseline  12/13/17 - improved by 5 degrees or more    Time  3    Period  Weeks    Status  Achieved      PT SHORT TERM GOAL #3   Title  Pt will be able to perform R SLS for 30 sec or > to be symmetrical with the LLE in order to maximize gait on uneven ground.    Baseline  12/13/17 - Bil  LE 30 seconds    Time  3    Period  Weeks    Status  Achieved        PT Long Term Goals - 12/13/17 1610      PT LONG TERM GOAL #1   Title  Pt will have improved bil ankle DF ROM to 5 deg and R PF ROM to 65 deg in order to decrease pain and maximize ability to complete functional tasks with greater ease.     Baseline  12/13/17 - Right DF = 3 degrees, Rigth PF = 55 degrees    Time  6    Period  Weeks    Status  On-going      PT LONG TERM GOAL #2   Title  Pt will have improved bil hip MMT to 5/5 and have no pain with ankle MMT to demo improved overall strength and maximize function at work.    Baseline  12/13/17 - all 5/5 throughout Bil LE    Time  6    Period  Weeks    Status  Achieved      PT LONG TERM GOAL #3   Title  Pt will report being able to climb ladders at work with 2/10 R ankle pain or < to demo improved ROM and functional strength of her R ankle.    Baseline  12/13/17 - Patient reports from 6-10/10     Time  6    Period  Weeks    Status  On-going      PT LONG TERM GOAL #4   Title  Pt will be able to perform R single leg step downs from 7" step 5/5 times with no evidence of unsteadiness,  proper mechanics, and no pain to demo improved ankle ROM and strength in order to maximize stair ambulation and funciton at work.    Baseline  12/13/17 - unable to perform on Rt LE due to ankel pain/stiffness    Time  6    Period  Weeks    Status  On-going            Plan - 12/22/17 1720    Clinical Impression Statement  Pt with increased pain today and feels like there is something "grinding" in her ankle.  States she feels like she should be wearing a "moon boot" to mobilize it.   Reports the taping continues to help with decreasing her symptoms and making it more tolerable.  Did not increase to level 3 today on BAPS or add any additional exericses due to increased pain today.  Pt with most challenge completing CW motion with BAPS and reported "grinding" when returninng to neutral while completing lateral movements with BAPS.  Manual completed priro to taping to help decrease soft tissue restictions.  Pt reported having minimal pain 2/10 at EOS.      Rehab Potential  Good    PT Frequency  2x / week    PT Duration  6 weeks    PT Treatment/Interventions  ADLs/Self Care Home Management;Cryotherapy;Electrical Stimulation;Moist Heat;Traction;Ultrasound;Gait training;Stair training;Functional mobility training;Therapeutic activities;Therapeutic exercise;Balance training;Neuromuscular re-education;Patient/family education;Manual techniques;Scar mobilization;Compression bandaging;Passive range of motion;Dry needling;Energy conservation;Taping;Splinting    PT Next Visit Plan  If pain decreased, progress to standing BAPS to level 3 and have pt pull sled to simulate work duties.  Continue with manual for soft tissue restrictions and pain control.  Focus on eccentric inversion/PF and dynamic stabllity activites.      PT Home Exercise Plan  eval: ABCs,  standing calf stretch; 11/24/2017 - foam complaint RT SLS; 2/26: knee drives on step + TB for self-ankle DF mob, isometric PF; 12/13/17 - K tap instruction, heel  raises with Wehrman;    Consulted and Agree with Plan of Care  Patient       Patient will benefit from skilled therapeutic intervention in order to improve the following deficits and impairments:  Abnormal gait, Decreased balance, Decreased range of motion, Decreased strength, Difficulty walking, Hypomobility, Increased fascial restricitons, Increased muscle spasms, Impaired flexibility, Improper body mechanics, Postural dysfunction, Pain  Visit Diagnosis: Pain in right ankle and joints of right foot  Stiffness of right ankle, not elsewhere classified  Other symptoms and signs involving the musculoskeletal system     Problem List There are no active problems to display for this patient.  Lurena Nidamy B Laird Runnion, PTA/CLT [phone]  Lurena NidaFrazier, Kipper Buch B 12/22/2017, 5:26 PM  [facility] [address] [place], [place], [address] Phone: [phone]   Fax:  [phone]  Name: Ann Walters MRN: [id number] Date of Birth: 01-23-1961

## 2017-12-23 ENCOUNTER — Encounter (HOSPITAL_COMMUNITY): Payer: Self-pay

## 2017-12-23 ENCOUNTER — Ambulatory Visit (HOSPITAL_COMMUNITY): Payer: 59

## 2017-12-23 DIAGNOSIS — M25671 Stiffness of right ankle, not elsewhere classified: Secondary | ICD-10-CM

## 2017-12-23 DIAGNOSIS — R29898 Other symptoms and signs involving the musculoskeletal system: Secondary | ICD-10-CM

## 2017-12-23 DIAGNOSIS — M25571 Pain in right ankle and joints of right foot: Secondary | ICD-10-CM | POA: Diagnosis not present

## 2017-12-23 NOTE — Therapy (Signed)
[facility] [address] [place], [place], [address] Phone: [phone]   Fax:  [phone]  Physical Therapy Treatment  Patient Details  Name: Ann DurieMichelle D Litts MRN: [id number] Date of Birth: 1961-04-20 Referring Provider: Jacinta ShoeJustin Pike [place], [place]   Encounter Date: 12/23/2017  PT End of Session - 12/23/17 0950    Visit Number  8    Number of Visits  13    Date for PT Re-Evaluation  01/03/18    Authorization Type  [employer] Open Access    Authorization Time Period  11/22/17 to 01/03/18    PT Start Time  0949    PT Stop Time  1030    PT Time Calculation (min)  41 min    Activity Tolerance  Patient tolerated treatment well    Behavior During Therapy  [facility] for tasks assessed/performed       History reviewed. No pertinent past medical history.  Past Surgical History:  Procedure Laterality Date  . ABDOMINAL HYSTERECTOMY    . AUGMENTATION MAMMAPLASTY Bilateral 2011  . BREAST ENHANCEMENT SURGERY    . BREAST EXCISIONAL BIOPSY Right   . COMBINED ABDOMINOPLASTY AND LIPOSUCTION      There were no vitals filed for this visit.  Subjective Assessment - 12/23/17 0950    Subjective  Pt states that her ankle is feeling a little better today, she states that since Amy wrapped it yesterday it's helped some. She states that she just woke up so it's not too bad right now.     Currently in Pain?  Yes    Pain Score  2     Pain Location  Ankle    Pain Orientation  Right;Mid    Pain Descriptors / Indicators  Aching    Pain Type  Chronic pain    Pain Onset  More than a month ago    Pain Frequency  Constant    Aggravating Factors   walking, stairs, work activities    Pain Relieving Factors  rest    Effect of Pain on Daily Activities  increases            [facility] Adult PT Treatment/Exercise - 12/23/17 0001      Manual Therapy   Manual Therapy  Joint mobilization    Manual therapy comments  Manual complete separate than rest of tx    Joint  Mobilization  R ankle joint distraction x9 mins total for pain control    Kinesiotex  -- did not reapply as was still intact from last session on3/14      Ankle Exercises: Stretches   Slant Board Stretch  3 reps;30 seconds      Ankle Exercises: Standing   Heel Raises Limitations  2x30 tennis Hale between heels to facilitate posterior tib activation (2nd set on foam)    Other Standing Ankle Exercises  R single leg eccentric PF on slant board 10x5" lower          PT Education - 12/23/17 0950    Education provided  Yes    Education Details  exercise technique; heavy education on s/s, condition, anatomy, risks and benefits of dry needling (see clinical impression below)    Person(s) Educated  Patient    Methods  Explanation;Demonstration    Comprehension  Verbalized understanding;Returned demonstration       PT Short Term Goals - 12/13/17 0952      PT SHORT TERM GOAL #1   Title  Pt will be independent with HEP  and perform consistently in order to decrease pain and maximize return to PLOF.    Baseline  12/13/17 - do it at least 1x per day    Time  3    Period  Weeks    Status  Achieved      PT SHORT TERM GOAL #2   Title  Pt will have improved R ankle ROM by 5 deg throughout to decrease pain and maximize gait and other functional tasks.    Baseline  12/13/17 - improved by 5 degrees or more    Time  3    Period  Weeks    Status  Achieved      PT SHORT TERM GOAL #3   Title  Pt will be able to perform R SLS for 30 sec or > to be symmetrical with the LLE in order to maximize gait on uneven ground.    Baseline  12/13/17 - Bil LE 30 seconds    Time  3    Period  Weeks    Status  Achieved        PT Long Term Goals - 12/13/17 1610      PT LONG TERM GOAL #1   Title  Pt will have improved bil ankle DF ROM to 5 deg and R PF ROM to 65 deg in order to decrease pain and maximize ability to complete functional tasks with greater ease.     Baseline  12/13/17 - Right DF = 3 degrees, Rigth PF =  55 degrees    Time  6    Period  Weeks    Status  On-going      PT LONG TERM GOAL #2   Title  Pt will have improved bil hip MMT to 5/5 and have no pain with ankle MMT to demo improved overall strength and maximize function at work.    Baseline  12/13/17 - all 5/5 throughout Bil LE    Time  6    Period  Weeks    Status  Achieved      PT LONG TERM GOAL #3   Title  Pt will report being able to climb ladders at work with 2/10 R ankle pain or < to demo improved ROM and functional strength of her R ankle.    Baseline  12/13/17 - Patient reports from 6-10/10     Time  6    Period  Weeks    Status  On-going      PT LONG TERM GOAL #4   Title  Pt will be able to perform R single leg step downs from 7" step 5/5 times with no evidence of unsteadiness, proper mechanics, and no pain to demo improved ankle ROM and strength in order to maximize stair ambulation and funciton at work.    Baseline  12/13/17 - unable to perform on Rt LE due to ankel pain/stiffness    Time  6    Period  Weeks    Status  On-going            Plan - 12/23/17 1047    Clinical Impression Statement  Pt presenting to therapy with continued complaints of slightly increased pain, however, slightly improved from yesterday. Pt continues to report grinding feeling deep in her anterior ankle. Gait analysis today demo'd increased R calcaneal eversion, which could be contributing to pt's symptoms as the musculature running posterior to the medial malleolus could be being put on increased stretch which causes increased tension to musculature. Pt  given extensive education on this, the natural progression of healing, potential contributing factors to her complaints, and the benefits and risks of dry needling. Pt reporting that great toe flexion also recreates the pain at her posterior medial malleoli; PT educated pt on anatomy of the local musculature and educated pt that dry needling these muscles could benefit her by decreasing her pain. She  was educated on the risks and benefits of it today and stated she was agreeable to try it at her next session. This PT feels pt would benefit from DN as her pain is able to be recreated with palpation to these muscles and with active muscle contractions of these same muscles. PT agreeable to try DN next visit. Ended session with manual ankle joint distraction and pt reporting that it felt great; she reported minimal pain at EOS.    Rehab Potential  Good    PT Frequency  2x / week    PT Duration  6 weeks    PT Treatment/Interventions  ADLs/Self Care Home Management;Cryotherapy;Electrical Stimulation;Moist Heat;Traction;Ultrasound;Gait training;Stair training;Functional mobility training;Therapeutic activities;Therapeutic exercise;Balance training;Neuromuscular re-education;Patient/family education;Manual techniques;Scar mobilization;Compression bandaging;Passive range of motion;Dry needling;Energy conservation;Taping;Splinting    PT Next Visit Plan  trigger point dry needling to R flexor hallicus longus, posterior tibialis; If pain decreased, progress to standing BAPS to level 3 and have pt pull sled to simulate work duties.  Continue with manual for soft tissue restrictions and pain control.  Focus on eccentric inversion/PF and dynamic stabllity activites.      PT Home Exercise Plan  eval: ABCs, standing calf stretch; 11/24/2017 - foam complaint RT SLS; 2/26: knee drives on step + TB for self-ankle DF mob, isometric PF; 12/13/17 - K tap instruction, heel raises with Mones;    Consulted and Agree with Plan of Care  Patient       Patient will benefit from skilled therapeutic intervention in order to improve the following deficits and impairments:  Abnormal gait, Decreased balance, Decreased range of motion, Decreased strength, Difficulty walking, Hypomobility, Increased fascial restricitons, Increased muscle spasms, Impaired flexibility, Improper body mechanics, Postural dysfunction, Pain  Visit  Diagnosis: Pain in right ankle and joints of right foot  Stiffness of right ankle, not elsewhere classified  Other symptoms and signs involving the musculoskeletal system     Problem List There are no active problems to display for this patient.      Jac Canavan PT, DPT  [facility] [address] [place], [place], [address] Phone: [phone]   Fax:  [phone]  Name: KLARISSA MCILVAIN MRN: [id number] Date of Birth: July 15, 1961

## 2017-12-27 ENCOUNTER — Ambulatory Visit (HOSPITAL_COMMUNITY): Payer: 59

## 2017-12-27 DIAGNOSIS — R29898 Other symptoms and signs involving the musculoskeletal system: Secondary | ICD-10-CM

## 2017-12-27 DIAGNOSIS — M25571 Pain in right ankle and joints of right foot: Secondary | ICD-10-CM

## 2017-12-27 DIAGNOSIS — M25671 Stiffness of right ankle, not elsewhere classified: Secondary | ICD-10-CM

## 2017-12-27 NOTE — Therapy (Signed)
[facility] [address] [place], [place], [address] Phone: [phone]   Fax:  [phone]  Physical Therapy Treatment  Patient Details  Name: Ann Walters MRN: [id number] Date of Birth: 1961/06/03 Referring Provider: Jacinta Shoe, [place]   Encounter Date: 12/27/2017  PT End of Session - 12/27/17 0946    Visit Number  9    Number of Visits  13    Date for PT Re-Evaluation  01/03/18    Authorization Type  [employer] APWU Open Access    Authorization Time Period  11/22/17 to 01/03/18    PT Start Time  0945    PT Stop Time  1025    PT Time Calculation (min)  40 min    Activity Tolerance  Patient tolerated treatment well    Behavior During Therapy  [facility] for tasks assessed/performed       No past medical history on file.  Past Surgical History:  Procedure Laterality Date  . ABDOMINAL HYSTERECTOMY    . AUGMENTATION MAMMAPLASTY Bilateral 2011  . BREAST ENHANCEMENT SURGERY    . BREAST EXCISIONAL BIOPSY Right   . COMBINED ABDOMINOPLASTY AND LIPOSUCTION      There were no vitals filed for this visit.  Subjective Assessment - 12/27/17 0948    Subjective  Pt reports that she took the tape off this morning. When it is off, her anterior ankle starts hurting again. She has to go into work at 12 today.     Currently in Pain?  Yes    Pain Score  2     Pain Location  Ankle    Pain Orientation  Right;Mid;Anterior    Pain Type  Chronic pain    Pain Onset  More than a month ago    Pain Frequency  Constant    Aggravating Factors   walking, stairs, work activities    Pain Relieving Factors  rest    Effect of Pain on Daily Activities  increases             [facility] Adult PT Treatment/Exercise - 12/27/17 0001      Manual Therapy   Manual Therapy  Soft tissue mobilization    Manual therapy comments  Manual complete separate than rest of tx    Soft tissue mobilization  STM to R posterior tib, flexor digitorum longus, abd hallicus, flexor  hallicus longus tendon following trigger point dry needling      Ankle Exercises: Standing   Heel Raises  Both;20 reps with Fielder between heels for post tib initiation      Ankle Exercises: Stretches   Slant Board Stretch  3 reps;30 seconds       Trigger Point Dry Needling - 12/27/17 1238    Consent Given?  Yes    Education Handout Provided  Yes    Muscles Treated Lower Body  -- tibialis posterior, flexor digitorum longus, abd hallicus    Tibialis Anterior Response  Twitch response elicited;Palpable increased muscle length tibialis posterior, flexor digitorum longus, abd hallicus             PT Education - 12/27/17 0946    Education provided  Yes    Education Details  trigger point dry needling benefits and risks    Person(s) Educated  Patient    Methods  Explanation;Handout    Comprehension  Verbalized understanding       PT Short Term Goals - 12/13/17 0952      PT SHORT TERM GOAL #1  Title  Pt will be independent with HEP and perform consistently in order to decrease pain and maximize return to PLOF.    Baseline  12/13/17 - do it at least 1x per day    Time  3    Period  Weeks    Status  Achieved      PT SHORT TERM GOAL #2   Title  Pt will have improved R ankle ROM by 5 deg throughout to decrease pain and maximize gait and other functional tasks.    Baseline  12/13/17 - improved by 5 degrees or more    Time  3    Period  Weeks    Status  Achieved      PT SHORT TERM GOAL #3   Title  Pt will be able to perform R SLS for 30 sec or > to be symmetrical with the LLE in order to maximize gait on uneven ground.    Baseline  12/13/17 - Bil LE 30 seconds    Time  3    Period  Weeks    Status  Achieved        PT Long Term Goals - 12/13/17 [id number]      PT LONG TERM GOAL #1   Title  Pt will have improved bil ankle DF ROM to 5 deg and R PF ROM to 65 deg in order to decrease pain and maximize ability to complete functional tasks with greater ease.     Baseline  12/13/17 - Right  DF = 3 degrees, Rigth PF = 55 degrees    Time  6    Period  Weeks    Status  On-going      PT LONG TERM GOAL #2   Title  Pt will have improved bil hip MMT to 5/5 and have no pain with ankle MMT to demo improved overall strength and maximize function at work.    Baseline  12/13/17 - all 5/5 throughout Bil LE    Time  6    Period  Weeks    Status  Achieved      PT LONG TERM GOAL #3   Title  Pt will report being able to climb ladders at work with 2/10 R ankle pain or < to demo improved ROM and functional strength of her R ankle.    Baseline  12/13/17 - Patient reports from 6-10/10     Time  6    Period  Weeks    Status  On-going      PT LONG TERM GOAL #4   Title  Pt will be able to perform R single leg step downs from 7" step 5/5 times with no evidence of unsteadiness, proper mechanics, and no pain to demo improved ankle ROM and strength in order to maximize stair ambulation and funciton at work.    Baseline  12/13/17 - unable to perform on Rt LE due to ankel pain/stiffness    Time  6    Period  Weeks    Status  On-going            Plan - 12/27/17 1236    Clinical Impression Statement  Pt presents to therapy with continued reports of pain at anterior ankle and behind medial malleoli. PT palpated and was noted to still have restrictions of, tenderness to, and pain with activation of R posterior tib, flexor digitorum longus (FDL), flexor hallicus longus, as well as to abductor hallicus (also a great toe flexor). Pt educated on  and provided handout on risks and benefits of trigger point dry needling and after discussing these, she verbalized consent to needling these areas. PT utilized three (3) 0.3x44mm used for FDL and posterior tib and one (1) 0.25x72mm for abductor hallicus. Twitch response elicited in each muscle group and decreased pain reported following. Pt with greatest response at distal FDL/posterior tibialis and abductor hallicus. Performed manual therapy following trigger point dry  needling to further promote tissue relaxation and decreased pain. Ended with bil calf stretch and heel raises with tennis Styles for posterior tib activation. Pt educated on side effects and that she will likely experience increased soreness and some minor bruising and she verbalized understanding.    Rehab Potential  Good    PT Frequency  2x / week    PT Duration  6 weeks    PT Treatment/Interventions  ADLs/Self Care Home Management;Cryotherapy;Electrical Stimulation;Moist Heat;Traction;Ultrasound;Gait training;Stair training;Functional mobility training;Therapeutic activities;Therapeutic exercise;Balance training;Neuromuscular re-education;Patient/family education;Manual techniques;Scar mobilization;Compression bandaging;Passive range of motion;Dry needling;Energy conservation;Taping;Splinting    PT Next Visit Plan  trigger point dry needling to R flexor digitorum longus, posterior tibialis, abductor hallicus; continue with standing BAPS to level 3 and have pt pull sled to simulate work duties. Continue with manual for soft tissue restrictions and pain control. Focus on eccentric inversion/PF and dynamic stabllity activites.      PT Home Exercise Plan  eval: ABCs, standing calf stretch; 11/24/2017 - foam complaint RT SLS; 2/26: knee drives on step + TB for self-ankle DF mob, isometric PF; 12/13/17 - K tap instruction, heel raises with [place]an;    Consulted and Agree with Plan of Care  Patient       Patient will benefit from skilled therapeutic intervention in order to improve the following deficits and impairments:  Abnormal gait, Decreased balance, Decreased range of motion, Decreased strength, Difficulty walking, Hypomobility, Increased fascial restricitons, Increased muscle spasms, Impaired flexibility, Improper body mechanics, Postural dysfunction, Pain  Visit Diagnosis: Pain in right ankle and joints of right foot  Stiffness of right ankle, not elsewhere classified  Other symptoms and signs  involving the musculoskeletal system     Problem List There are no active problems to display for this patient.      Jac Canavan PT, DPT  [facility] [address] [place], [place], [address] Phone: [phone]   Fax:  [phone]  Name: Ann Walters MRN: [id number] Date of Birth: [birth year]

## 2017-12-27 NOTE — Patient Instructions (Signed)

## 2017-12-29 ENCOUNTER — Encounter (HOSPITAL_COMMUNITY): Payer: Self-pay

## 2017-12-29 ENCOUNTER — Ambulatory Visit (HOSPITAL_COMMUNITY): Payer: 59

## 2017-12-29 DIAGNOSIS — R29898 Other symptoms and signs involving the musculoskeletal system: Secondary | ICD-10-CM

## 2017-12-29 DIAGNOSIS — M25571 Pain in right ankle and joints of right foot: Secondary | ICD-10-CM

## 2017-12-29 DIAGNOSIS — M25671 Stiffness of right ankle, not elsewhere classified: Secondary | ICD-10-CM

## 2017-12-29 NOTE — Therapy (Signed)
[facility] [address] [place], [place], [address] Phone: [phone]   Fax:  [phone]  Physical Therapy Treatment  Patient Details  Name: Ann Walters MRN: [id number] Date of Birth: Jun 15, 1961 Referring Provider: Jacinta Shoe, [place]   Encounter Date: 12/29/2017  PT End of Session - 12/29/17 0953    Visit Number  10    Number of Visits  13    Date for PT Re-Evaluation  01/03/18    Authorization Type  [employer] Open Access    Authorization Time Period  11/22/17 to 01/03/18    PT Start Time  0953 pt arrived late    PT Stop Time  1028    PT Time Calculation (min)  35 min    Activity Tolerance  Patient tolerated treatment well    Behavior During Therapy  [facility] for tasks assessed/performed       History reviewed. No pertinent past medical history.  Past Surgical History:  Procedure Laterality Date  . ABDOMINAL HYSTERECTOMY    . AUGMENTATION MAMMAPLASTY Bilateral 2011  . BREAST ENHANCEMENT SURGERY    . BREAST EXCISIONAL BIOPSY Right   . COMBINED ABDOMINOPLASTY AND LIPOSUCTION      There were no vitals filed for this visit.  Subjective Assessment - 12/29/17 0953    Subjective  Pt states that her calf and shin are sore from the needling. She's still having pain around her medial malleoli.    Currently in Pain?  Yes    Pain Score  1     Pain Location  Ankle    Pain Orientation  Right;Mid    Pain Descriptors / Indicators  Aching    Pain Type  Chronic pain    Pain Onset  More than a month ago    Pain Frequency  Constant    Aggravating Factors   walking, stairs, work activities    Pain Relieving Factors  rest    Effect of Pain on Daily Activities  increases         [facility] Adult PT Treatment/Exercise - 12/29/17 0001      Manual Therapy   Manual Therapy  Soft tissue mobilization    Manual therapy comments  Manual complete separate than rest of tx    Soft tissue mobilization  STM to R posterior tib, flexor digitorum  longus, abd hallicus, flexor hallicus longus tendons, muscle bellies as well as abductor hallicus      Ankle Exercises: Stretches   Slant Board Stretch  3 reps;30 seconds    Other Stretch  plantar fascia stretch at wall 3x30"      Ankle Exercises: Standing   SLS  on 1/2 foam roll upside down 10x10" each    Heel Raises Limitations  1x30 on foam with tennis [place] between heels to facilitate posterior tib activation; R single leg heel raises on foam x30; eccentric PF RLE on slant board 2x15 reps    Other Standing Ankle Exercises  pushing/pulling sled with50# to simulate work duties x2RT           PT Education - 12/29/17 1030    Education provided  Yes    Education Details  exercise technique, local anatomy; will reassess next visit    Person(s) Educated  Patient    Methods  Explanation    Comprehension  Verbalized understanding       PT Short Term Goals - 12/13/17 0952      PT SHORT TERM GOAL #1   Title  Pt will be independent with HEP and perform consistently in order to decrease pain and maximize return to PLOF.    Baseline  12/13/17 - do it at least 1x per day    Time  3    Period  Weeks    Status  Achieved      PT SHORT TERM GOAL #2   Title  Pt will have improved R ankle ROM by 5 deg throughout to decrease pain and maximize gait and other functional tasks.    Baseline  12/13/17 - improved by 5 degrees or more    Time  3    Period  Weeks    Status  Achieved      PT SHORT TERM GOAL #3   Title  Pt will be able to perform R SLS for 30 sec or > to be symmetrical with the LLE in order to maximize gait on uneven ground.    Baseline  12/13/17 - Bil LE 30 seconds    Time  3    Period  Weeks    Status  Achieved        PT Long Term Goals - 12/13/17 1610      PT LONG TERM GOAL #1   Title  Pt will have improved bil ankle DF ROM to 5 deg and R PF ROM to 65 deg in order to decrease pain and maximize ability to complete functional tasks with greater ease.     Baseline  12/13/17 -  Right DF = 3 degrees, Rigth PF = 55 degrees    Time  6    Period  Weeks    Status  On-going      PT LONG TERM GOAL #2   Title  Pt will have improved bil hip MMT to 5/5 and have no pain with ankle MMT to demo improved overall strength and maximize function at work.    Baseline  12/13/17 - all 5/5 throughout Bil LE    Time  6    Period  Weeks    Status  Achieved      PT LONG TERM GOAL #3   Title  Pt will report being able to climb ladders at work with 2/10 R ankle pain or < to demo improved ROM and functional strength of her R ankle.    Baseline  12/13/17 - Patient reports from 6-10/10     Time  6    Period  Weeks    Status  On-going      PT LONG TERM GOAL #4   Title  Pt will be able to perform R single leg step downs from 7" step 5/5 times with no evidence of unsteadiness, proper mechanics, and no pain to demo improved ankle ROM and strength in order to maximize stair ambulation and funciton at work.    Baseline  12/13/17 - unable to perform on Rt LE due to ankel pain/stiffness    Time  6    Period  Weeks    Status  On-going            Plan - 12/29/17 1031    Clinical Impression Statement  Treatment session slightly limited as pt arrived a few mins late. She states that the needling helped some places in her ankle but not everywhere; she did report that she noticed she was walking normal for a while the morning after. She states that her pain is still located primarily behind her medial malleoli and it was red and  swollen yesterday after working. Today's session focused on R ankle strength, stability, and dynamic strengthening of R ankle. Simulated work duties by [employer]ushing/pulling sled with 50#; pt demonstrating that her pain occurs during pulling and during the eccentric phase of PF. Pt continues to inquire about anatomy/what and why things are occurring the way they are. PT educated pt again on anatomy and that there is not a lot of space behind the medial malleoli with lots of anatomical  structures located there and that is likely why it is swelling there at night. Ended with manual to R posterior tib, flexor digitorum longus, and flexor hallicus longus tendons and muscle bellies as well as abductor hallicus. Pt reported feeling better at EOS. Pt due for reassessment next visit.    Rehab Potential  Good    PT Frequency  2x / week    PT Duration  6 weeks    PT Treatment/Interventions  ADLs/Self Care Home Management;Cryotherapy;Electrical Stimulation;Moist Heat;Traction;Ultrasound;Gait training;Stair training;Functional mobility training;Therapeutic activities;Therapeutic exercise;Balance training;Neuromuscular re-education;Patient/family education;Manual techniques;Scar mobilization;Compression bandaging;Passive range of motion;Dry needling;Energy conservation;Taping;Splinting    PT Next Visit Plan  reassessment; trigger point dry needling to R flexor digitorum longus, posterior tibialis, abductor hallicus; continue with standing BAPS to level 3 and have pt pull sled to simulate work duties. Continue with manual for soft tissue restrictions and pain control. Focus on eccentric inversion/PF and dynamic stabllity activites.      PT Home Exercise Plan  eval: ABCs, standing calf stretch; 11/24/2017 - foam complaint RT SLS; 2/26: knee drives on step + TB for self-ankle DF mob, isometric PF; 12/13/17 - K tap instruction, heel raises with [place];    Consulted and Agree with Plan of Care  Patient       Patient will benefit from skilled therapeutic intervention in order to improve the following deficits and impairments:  Abnormal gait, Decreased balance, Decreased range of motion, Decreased strength, Difficulty walking, Hypomobility, Increased fascial restricitons, Increased muscle spasms, Impaired flexibility, Improper body mechanics, Postural dysfunction, Pain  Visit Diagnosis: Pain in right ankle and joints of right foot  Stiffness of right ankle, not elsewhere classified  Other symptoms and  signs involving the musculoskeletal system     Problem List There are no active problems to display for this patient.       Jac CanavanBrooke Lennie Dunnigan PT, DPT  [facility] [address] [place], [place], [address] Phone: [phone]   Fax:  [phone]  Name: Kemper DurieMichelle D Stooksbury MRN: [id number] Date of Birth: [birth year]

## 2018-01-03 ENCOUNTER — Encounter (HOSPITAL_COMMUNITY): Payer: Self-pay

## 2018-01-03 ENCOUNTER — Ambulatory Visit (HOSPITAL_COMMUNITY): Payer: 59

## 2018-01-03 DIAGNOSIS — M25671 Stiffness of right ankle, not elsewhere classified: Secondary | ICD-10-CM

## 2018-01-03 DIAGNOSIS — M25571 Pain in right ankle and joints of right foot: Secondary | ICD-10-CM

## 2018-01-03 DIAGNOSIS — R29898 Other symptoms and signs involving the musculoskeletal system: Secondary | ICD-10-CM

## 2018-01-03 NOTE — Therapy (Signed)
[place], [place], [address] Phone: [phone]   Fax:  [phone]  Physical Therapy Treatment/Reassessment  Patient Details  Name: Ann Walters MRN: [id number] Date of Birth: [birth year] Referring Provider: Corky Sing, [place]   Encounter Date: 01/03/2018  PT End of Session - 01/03/18 1201    Visit Number  11    Number of Visits  19    Date for PT Re-Evaluation  01/26/18    Authorization Type  [employer] Open Access    Authorization Time Period  11/22/17 to 01/03/18; NEW: 01/03/18 to 01/26/18    PT Start Time  1118    PT Stop Time  1203    PT Time Calculation (min)  45 min    Activity Tolerance  Patient tolerated treatment well    Behavior During Therapy  [facility] for tasks assessed/performed       History reviewed. No pertinent past medical history.  Past Surgical History:  Procedure Laterality Date  . ABDOMINAL HYSTERECTOMY    . AUGMENTATION MAMMAPLASTY Bilateral 2011  . BREAST ENHANCEMENT SURGERY    . BREAST EXCISIONAL BIOPSY Right   . COMBINED ABDOMINOPLASTY AND LIPOSUCTION      There were no vitals filed for this visit.  Subjective Assessment - 01/03/18 1119    Subjective  Pt reports that 2 days off of work helped her ankle some. She reports her pain currently is about a 2/10, but she hasn't been on it much. Her ankle is still swelling after work.    Currently in Pain?  Yes    Pain Score  2     Pain Location  Ankle    Pain Orientation  Right;Mid    Pain Descriptors / Indicators  Aching    Pain Type  Chronic pain    Pain Onset  More than a month ago    Pain Frequency  Constant    Aggravating Factors   walking, stairs, work activities    Pain Relieving Factors  rest    Effect of Pain on Daily Activities  increases         [facility] PT Assessment - 01/03/18 0001      Assessment   Medical Diagnosis  Posterior tibialis tendonitis    Referring Provider  Corky Sing, PA-C    Onset  Date/Surgical Date  07/22/17    Next MD Visit  none scheduled    Prior Therapy  yes for outer ankle      Functional Tests   Functional tests  Step down      Step Down   Comments  single leg step down on 7" step: on R: increased unsteadiness, decreased control and reports of pain; on L: min unsteadiness but improved control, no pain      AROM   Right Ankle Dorsiflexion  3 was 3    Right Ankle Plantar Flexion  65 was 55    Left Ankle Dorsiflexion  3 was 3      Strength   Right Ankle Dorsiflexion  5/5    Right Ankle Inversion  5/5 active inversion caused pain    Right Ankle Eversion  5/5           [facility] Adult PT Treatment/Exercise - 01/03/18 0001      Manual Therapy   Manual Therapy  Soft tissue mobilization    Manual therapy comments  Manual complete separate than rest of tx    Soft tissue mobilization  STM  to R abductor hallicus , quadratus plante and flexor digitorum brevis following dry needling      Ankle Exercises: Seated   Towel Crunch  5 reps RLE only       Trigger Point Dry Needling - 01/03/18 1159    Consent Given?  Yes    Education Handout Provided  Yes    Muscles Treated Lower Body  -- abductor hallicus,quadratus plantae, flexor digitorum brevis    Tibialis Anterior Response  Twitch response elicited;Palpable increased muscle length abductor hallicus,quadratus plantae, flexor digitorum brevis             PT Short Term Goals - 01/03/18 1121      PT SHORT TERM GOAL #1   Title  Pt will be independent with HEP and perform consistently in order to decrease pain and maximize return to PLOF.    Baseline  12/13/17 - do it at least 1x per day    Time  3    Period  Weeks    Status  Achieved      PT SHORT TERM GOAL #2   Title  Pt will have improved R ankle ROM by 5 deg throughout to decrease pain and maximize gait and other functional tasks.    Baseline  12/13/17 - improved by 5 degrees or more    Time  3    Period  Weeks    Status  Achieved      PT SHORT  TERM GOAL #3   Title  Pt will be able to perform R SLS for 30 sec or > to be symmetrical with the LLE in order to maximize gait on uneven ground.    Baseline  12/13/17 - Bil LE 30 seconds    Time  3    Period  Weeks    Status  Achieved        PT Long Term Goals - 01/03/18 1121      PT LONG TERM GOAL #1   Title  Pt will have improved bil ankle DF ROM to 5 deg and R PF ROM to 65 deg in order to decrease pain and maximize ability to complete functional tasks with greater ease.     Baseline  3/5:  - bil DF = 3 degrees, Rigth PF = 65degrees    Time  6    Period  Weeks    Status  Partially Met      PT LONG TERM GOAL #2   Title  Pt will have improved bil hip MMT to 5/5 and have no pain with ankle MMT to demo improved overall strength and maximize function at work.    Baseline  3/5: all 5/5 throughout Bil LE, but did have pain with R ankle inversion MMT    Time  6    Period  Weeks    Status  Partially Met      PT LONG TERM GOAL #3   Title  Pt will report being able to climb ladders at work with 2/10 R ankle pain or < to demo improved ROM and functional strength of her R ankle.    Baseline  3/26: climbing ladders at work is okay until the end of the night, with pain averaging no more than 3/10.     Time  6    Period  Weeks    Status  On-going      PT LONG TERM GOAL #4   Title  Pt will be able to perform R single leg  step downs from 7" step 5/5 times with no evidence of unsteadiness, proper mechanics, and no pain to demo improved ankle ROM and strength in order to maximize stair ambulation and funciton at work.    Baseline  3/26: performed 5/5 reps but demo'd increaed unsteadiness, decreased control and R ankle pain    Time  6    Period  Weeks    Status  On-going            Plan - 01/03/18 1215    Clinical Impression Statement  PT reassessed pt's goals and outcome measures this date. Pt has made progress towards all goals as illustrated above. She still has pain with active R  ankle inversion and is still c/o pain at her medial malleoli. Her ankle DF ROM is still +3deg past neutral and her PF ROM is WNL now. She does report feeling therapy has helped 50-60% (ROM improved, decreased pain shooting up, swelling is down); remaining 40-50% is the localized pain at her distal R ankle/medial malleoli and arch of the foot. Feels her walking is improved, especially since the needling. Pt continues with palpable taut bands and reports of recreation of pain to abductor hallicus and quadratus plantae. PT recommends brief extension of therapy services to continue to dry needle and strengthen the R ankle. Pt agreeable to dry needling again this date. PT targeting quadratus plantae, flexor digitorum brevis, and abductor hallicus; twitch response elicited in all muscle groups, with abductor hallicus being the largest and most symptomatic. Manual STM and muscle activation performed to muscles following needling. Pt reported decreased pain overall and her gait was visibly improved following. Will continue current POC focusing on strengthening (especially eccentric), ROM, and manual techniques (including dry needling) to address deficits remaining in order to decrease pain and maximize function at work.    Rehab Potential  Good    PT Frequency  2x / week    PT Duration  6 weeks    PT Treatment/Interventions  ADLs/Self Care Home Management;Cryotherapy;Electrical Stimulation;Moist Heat;Traction;Ultrasound;Gait training;Stair training;Functional mobility training;Therapeutic activities;Therapeutic exercise;Balance training;Neuromuscular re-education;Patient/family education;Manual techniques;Scar mobilization;Compression bandaging;Passive range of motion;Dry needling;Energy conservation;Taping;Splinting    PT Next Visit Plan  trigger point dry needling to R abductor hallicus, quadratus plantae, flexor digitorum brevis (flexor digitorum longus, posterior tibialis, abductor hallicus PRN); continue with  standing BAPS to level 3 and have pt pull sled to simulate work duties. Continue with manual for soft tissue restrictions and pain control. Focus on eccentric inversion/PF and dynamic stabllity activites.      PT Home Exercise Plan  eval: ABCs, standing calf stretch; 11/24/2017 - foam complaint RT SLS; 2/26: knee drives on step + TB for self-ankle DF mob, isometric PF; 12/13/17 - K tap instruction, heel raises with [place];    Consulted and Agree with Plan of Care  Patient       Patient will benefit from skilled therapeutic intervention in order to improve the following deficits and impairments:  Abnormal gait, Decreased balance, Decreased range of motion, Decreased strength, Difficulty walking, Hypomobility, Increased fascial restricitons, Increased muscle spasms, Impaired flexibility, Improper body mechanics, Postural dysfunction, Pain  Visit Diagnosis: Pain in right ankle and joints of right foot - Plan: PT plan of care cert/re-cert  Stiffness of right ankle, not elsewhere classified - Plan: PT plan of care cert/re-cert  Other symptoms and signs involving the musculoskeletal system - Plan: PT plan of care cert/re-cert     Problem List There are no active problems to display for this patient.  Geraldine Solar PT, [place] [address] [place], [place], [address] Phone: [phone]   Fax:  [phone]  Name: MAIZEY MENENDEZ MRN: [id number] Date of Birth: [birth year]

## 2018-01-05 ENCOUNTER — Ambulatory Visit (HOSPITAL_COMMUNITY): Payer: 59

## 2018-01-05 DIAGNOSIS — R29898 Other symptoms and signs involving the musculoskeletal system: Secondary | ICD-10-CM

## 2018-01-05 DIAGNOSIS — M25571 Pain in right ankle and joints of right foot: Secondary | ICD-10-CM

## 2018-01-05 DIAGNOSIS — M25671 Stiffness of right ankle, not elsewhere classified: Secondary | ICD-10-CM

## 2018-01-05 NOTE — Therapy (Signed)
[place], [place], [address] Phone: [phone]   Fax:  [phone]  Physical Therapy Treatment  Patient Details  Name: Ann Walters MRN: [id number] Date of Birth: Feb 13, 1961 Referring Provider: Corky Sing, [place]   Encounter Date: 01/05/2018  PT End of Session - 01/05/18 1137    Visit Number  12    Number of Visits  19    Date for PT Re-Evaluation  01/26/18    Authorization Type  [employer] Open Access    Authorization Time Period  11/22/17 to 01/03/18; NEW: 01/03/18 to 01/26/18    PT Start Time  1041    PT Stop Time  1120    PT Time Calculation (min)  39 min    Activity Tolerance  Patient tolerated treatment well;No increased pain    Behavior During Therapy  [facility] for tasks assessed/performed       No past medical history on file.  Past Surgical History:  Procedure Laterality Date  . ABDOMINAL HYSTERECTOMY    . AUGMENTATION MAMMAPLASTY Bilateral 2011  . BREAST ENHANCEMENT SURGERY    . BREAST EXCISIONAL BIOPSY Right   . COMBINED ABDOMINOPLASTY AND LIPOSUCTION      There were no vitals filed for this visit.  Subjective Assessment - 01/05/18 1046    Subjective  Pt reports a significant set-back since last visit, beginning about 24hours after last session. She reports a worsening pain in the medial ankle, as well as now the lateral malleolus. She has more swelling focal to the medical malleously which is visible now. She denies cahnge in activity recently, denies any acute trauma, does not think it is related to dry needling episode.     Currently in Pain?  Yes    Pain Score  -- 4.5/10 at medial and lateral ankle, but up to a 7 with standing.          [facility] PT Assessment - 01/05/18 0001      Special Tests    Special Tests  Ankle/Foot Special Tests    Other special tests  Calcaneal Squeeze: neg; Syndesmotic Squeeze: negative     Ankle/Foot Special Tests   Dorsiflexion-Eversion Test;Great Toe  Extension Test      Great Toe Extension Test    Comments  negative      Dorsiflexion-Eversion Test   Findings  Negative    Side  Right           [facility] Adult PT Treatment/Exercise - 01/05/18 0001      Manual Therapy   Manual Therapy  Joint mobilization;Myofascial release;Passive ROM    Joint Mobilization  R ankle joint distraction: 5x60sec    Myofascial Release  Tibialis Posterior/FDL/Fibularis Longus/Brevis  26 minutes    Passive ROM  Seated Ankle Dorsiflexion Stretch: 5x60seconds             PT Education - 01/05/18 1134    Education provided  Yes    Education Details  education on using cold bath 55-60 degrees for foot submersion rather than ice pack application    Person(s) Educated  Patient    Methods  Explanation;Demonstration    Comprehension  Need further instruction       PT Short Term Goals - 01/03/18 1121      PT SHORT TERM GOAL #1   Title  Pt will be independent with HEP and perform consistently in order to decrease pain and maximize return to PLOF.    Baseline  12/13/17 -  do it at least 1x per day    Time  3    Period  Weeks    Status  Achieved      PT SHORT TERM GOAL #2   Title  Pt will have improved R ankle ROM by 5 deg throughout to decrease pain and maximize gait and other functional tasks.    Baseline  12/13/17 - improved by 5 degrees or more    Time  3    Period  Weeks    Status  Achieved      PT SHORT TERM GOAL #3   Title  Pt will be able to perform R SLS for 30 sec or > to be symmetrical with the LLE in order to maximize gait on uneven ground.    Baseline  12/13/17 - Bil LE 30 seconds    Time  3    Period  Weeks    Status  Achieved        PT Long Term Goals - 01/03/18 1121      PT LONG TERM GOAL #1   Title  Pt will have improved bil ankle DF ROM to 5 deg and R PF ROM to 65 deg in order to decrease pain and maximize ability to complete functional tasks with greater ease.     Baseline  3/5:  - bil DF = 3 degrees, Rigth PF = 65degrees     Time  6    Period  Weeks    Status  Partially Met      PT LONG TERM GOAL #2   Title  Pt will have improved bil hip MMT to 5/5 and have no pain with ankle MMT to demo improved overall strength and maximize function at work.    Baseline  3/5: all 5/5 throughout Bil LE, but did have pain with R ankle inversion MMT    Time  6    Period  Weeks    Status  Partially Met      PT LONG TERM GOAL #3   Title  Pt will report being able to climb ladders at work with 2/10 R ankle pain or < to demo improved ROM and functional strength of her R ankle.    Baseline  3/26: climbing ladders at work is okay until the end of the night, with pain averaging no more than 3/10.     Time  6    Period  Weeks    Status  On-going      PT LONG TERM GOAL #4   Title  Pt will be able to perform R single leg step downs from 7" step 5/5 times with no evidence of unsteadiness, proper mechanics, and no pain to demo improved ankle ROM and strength in order to maximize stair ambulation and funciton at work.    Baseline  3/26: performed 5/5 reps but demo'd increaed unsteadiness, decreased control and R ankle pain    Time  6    Period  Weeks    Status  On-going            Plan - 01/05/18 1140    Clinical Impression Statement  Pt arrived with insidious acute exacerbation of symptoms as well as new symptoms at the distal lateral ankle 1-2 inches superior the malleolus. localized swelling at the lateral malleolar bursa noted. Anterior capsule WNL. Calcaneal squeeze Test negattive, as is sequential tib-fib compression testing of the syndesmosis. Lateral ankle pain is difficult to isolate anatomically with clear tenderness at  the malleolus itself, as well as the nearby fibularis longus tendon adn fibularis brevis muscle belly. Pt also notes generalized discomforts with dorsiflexion stretches this date, performed with knee flexioned to 90 degrees hence gastroc/achilles involvement less likely than other tendos deep to the distal  soleus. Noted tight bands with the fibularis group with good soft tissue quality response s/p myofascial release. Held additional loading this date as well dry needling until can discuss this visit with evaluating therapist. Pt educated on cold water submersion for edema/swelling management, as well as bringin in work shoes and orthoics next session. Of note, wear pattern on shoes this date demonstrate very neutral mid stand to toe off pattern suggestive fo mechnics inconsistent with PTTD/medial collapse.     Rehab Potential  Good    PT Frequency  2x / week    PT Duration  6 weeks    PT Treatment/Interventions  ADLs/Self Care Home Management;Cryotherapy;Electrical Stimulation;Moist Heat;Traction;Ultrasound;Gait training;Stair training;Functional mobility training;Therapeutic activities;Therapeutic exercise;Balance training;Neuromuscular re-education;Patient/family education;Manual techniques;Scar mobilization;Compression bandaging;Passive range of motion;Dry needling;Energy conservation;Taping;Splinting    PT Next Visit Plan  trigger point dry needling to R abductor hallicus, quadratus plantae, flexor digitorum brevis (flexor digitorum longus, posterior tibialis, abductor hallicus PRN); continue with standing BAPS to level 3 and have pt pull sled to simulate work duties. Continue with manual for soft tissue restrictions and pain control. Focus on eccentric inversion/PF and dynamic stabllity activites.      PT Home Exercise Plan  eval: ABCs, standing calf stretch; 11/24/2017 - foam complaint RT SLS; 2/26: knee drives on step + TB for self-ankle DF mob, isometric PF; 12/13/17 - K tap instruction, heel raises with [place];    Consulted and Agree with Plan of Care  Patient       Patient will benefit from skilled therapeutic intervention in order to improve the following deficits and impairments:  Abnormal gait, Decreased balance, Decreased range of motion, Decreased strength, Difficulty walking, Hypomobility,  Increased fascial restricitons, Increased muscle spasms, Impaired flexibility, Improper body mechanics, Postural dysfunction, Pain  Visit Diagnosis: Pain in right ankle and joints of right foot  Stiffness of right ankle, not elsewhere classified  Other symptoms and signs involving the musculoskeletal system     Problem List There are no active problems to display for this patient.  11:50 AM, 01/05/18 Etta Grandchild, PT, DPT Physical Therapist - [place] [phone] (Office)    Etta Grandchild 01/05/2018, 11:49 AM  [place] [address] [place], [place], [address] Phone: [phone]   Fax:  [phone]  Name: NAKAYA MISHKIN MRN: [id number] Date of Birth: [birth year]

## 2018-01-13 ENCOUNTER — Encounter (HOSPITAL_COMMUNITY): Payer: Self-pay

## 2018-01-13 ENCOUNTER — Ambulatory Visit (HOSPITAL_COMMUNITY): Payer: 59 | Attending: Student

## 2018-01-13 DIAGNOSIS — R29898 Other symptoms and signs involving the musculoskeletal system: Secondary | ICD-10-CM | POA: Insufficient documentation

## 2018-01-13 DIAGNOSIS — M25571 Pain in right ankle and joints of right foot: Secondary | ICD-10-CM | POA: Diagnosis not present

## 2018-01-13 DIAGNOSIS — M25671 Stiffness of right ankle, not elsewhere classified: Secondary | ICD-10-CM

## 2018-01-13 NOTE — Therapy (Signed)
[place], [place], [address] Phone: [phone]   Fax:  [phone]  Physical Therapy Treatment  Patient Details  Name: Ann Walters MRN: [id number] Date of Birth: [birth year] Referring Provider: Corky Sing, [place]   Encounter Date: 01/13/2018  PT End of Session - 01/13/18 0819    Visit Number  13    Number of Visits  19    Date for PT Re-Evaluation  01/26/18    Authorization Type  [employer] Open Access    Authorization Time Period  11/22/17 to 01/03/18; NEW: 01/03/18 to 01/26/18    Authorization - Visit Number  13    Authorization - Number of Visits  60    PT Start Time  3818    PT Stop Time  2993    PT Time Calculation (min)  40 min    Activity Tolerance  Patient tolerated treatment well;No increased pain    Behavior During Therapy  [facility] for tasks assessed/performed       History reviewed. No pertinent past medical history.  Past Surgical History:  Procedure Laterality Date  . ABDOMINAL HYSTERECTOMY    . AUGMENTATION MAMMAPLASTY Bilateral 2011  . BREAST ENHANCEMENT SURGERY    . BREAST EXCISIONAL BIOPSY Right   . COMBINED ABDOMINOPLASTY AND LIPOSUCTION      There were no vitals filed for this visit.  Subjective Assessment - 01/13/18 0820    Subjective  Pt reports that yesterday she had a "deep deep pop" at her anterior ankle and it has felt slightly better since. But it is still hurting on the medial side.     Currently in Pain?  Yes    Pain Score  2     Pain Location  Ankle    Pain Orientation  Right;Medial    Pain Descriptors / Indicators  Aching    Pain Type  Chronic pain    Pain Onset  More than a month ago    Pain Frequency  Constant    Aggravating Factors   walking, stairs, work activities     Pain Relieving Factors  rest     Effect of Pain on Daily Activities  increases            [facility] Adult PT Treatment/Exercise - 01/13/18 0001      Manual Therapy   Manual Therapy  Joint  mobilization;Myofascial release;Passive ROM    Manual therapy comments  Manual complete separate than rest of tx    Myofascial Release  Tibialis Posterior/FDL, abductor hallicus, soleus    Kinesiotex  Inhibit Muscle R posterior tib      Ankle Exercises: Seated   BAPS  Sitting;Level 3;10 reps DF/PF, inv/ev, CW/CCW      Ankle Exercises: Stretches   Slant Board Stretch  3 reps;30 seconds         PT Education - 01/13/18 7169    Education provided  Yes    Education Details  exercise technqiue     Person(s) Educated  Patient    Methods  Explanation;Demonstration    Comprehension  Verbalized understanding;Returned demonstration       PT Short Term Goals - 01/03/18 1121      PT SHORT TERM GOAL #1   Title  Pt will be independent with HEP and perform consistently in order to decrease pain and maximize return to PLOF.    Baseline  12/13/17 - do it at least 1x per day    Time  3  Period  Weeks    Status  Achieved      PT SHORT TERM GOAL #2   Title  Pt will have improved R ankle ROM by 5 deg throughout to decrease pain and maximize gait and other functional tasks.    Baseline  12/13/17 - improved by 5 degrees or more    Time  3    Period  Weeks    Status  Achieved      PT SHORT TERM GOAL #3   Title  Pt will be able to perform R SLS for 30 sec or > to be symmetrical with the LLE in order to maximize gait on uneven ground.    Baseline  12/13/17 - Bil LE 30 seconds    Time  3    Period  Weeks    Status  Achieved        PT Long Term Goals - 01/03/18 1121      PT LONG TERM GOAL #1   Title  Pt will have improved bil ankle DF ROM to 5 deg and R PF ROM to 65 deg in order to decrease pain and maximize ability to complete functional tasks with greater ease.     Baseline  3/5:  - bil DF = 3 degrees, Rigth PF = 65degrees    Time  6    Period  Weeks    Status  Partially Met      PT LONG TERM GOAL #2   Title  Pt will have improved bil hip MMT to 5/5 and have no pain with ankle MMT to  demo improved overall strength and maximize function at work.    Baseline  3/5: all 5/5 throughout Bil LE, but did have pain with R ankle inversion MMT    Time  6    Period  Weeks    Status  Partially Met      PT LONG TERM GOAL #3   Title  Pt will report being able to climb ladders at work with 2/10 R ankle pain or < to demo improved ROM and functional strength of her R ankle.    Baseline  3/26: climbing ladders at work is okay until the end of the night, with pain averaging no more than 3/10.     Time  6    Period  Weeks    Status  On-going      PT LONG TERM GOAL #4   Title  Pt will be able to perform R single leg step downs from 7" step 5/5 times with no evidence of unsteadiness, proper mechanics, and no pain to demo improved ankle ROM and strength in order to maximize stair ambulation and funciton at work.    Baseline  3/26: performed 5/5 reps but demo'd increaed unsteadiness, decreased control and R ankle pain    Time  6    Period  Weeks    Status  On-going            Plan - 01/13/18 0901    Clinical Impression Statement  Pt presents to therapy in her work shoes. Wear pattern appears WNL, however, these shoes are 2.5 months old compared to her other shoes she usually wears to therapy which are a couple of years old. Began session with manual to R medial ankle (posterior tib, FDL, abductor hallicus, and soleus). Pt with increased tenderness to soleus; PT feels this is worth addressing more as potential tight soleus could compress/restrict muscles deep to it (i.e.  posterior tib, FDL, FHL). Also, if soleus is tight, this could cause increased PF which puts posterior tib, FDL, FHL on constant tension/activation which could potentially be playing a role in her pain. Ended session with BAPS and slant board stretch for further pain management and muscle activation. Applied K-tape to pt's R posterior tib for muscle inhibition and pain control.     Rehab Potential  Good    PT Frequency  2x /  week    PT Duration  6 weeks    PT Treatment/Interventions  ADLs/Self Care Home Management;Cryotherapy;Electrical Stimulation;Moist Heat;Traction;Ultrasound;Gait training;Stair training;Functional mobility training;Therapeutic activities;Therapeutic exercise;Balance training;Neuromuscular re-education;Patient/family education;Manual techniques;Scar mobilization;Compression bandaging;Passive range of motion;Dry needling;Energy conservation;Taping;Splinting    PT Next Visit Plan  trigger point dry needling to R abductor hallicus, quadratus plantae, flexor digitorum brevis (flexor digitorum longus, posterior tibialis, abductor hallicus PRN); continue with standing BAPS to level 3 and have pt pull sled to simulate work duties. Continue with manual for soft tissue restrictions and pain control. Focus on eccentric inversion/PF and dynamic stabllity activites.      PT Home Exercise Plan  eval: ABCs, standing calf stretch; 11/24/2017 - foam complaint RT SLS; 2/26: knee drives on step + TB for self-ankle DF mob, isometric PF; 12/13/17 - K tap instruction, heel raises with [place];    Consulted and Agree with Plan of Care  Patient       Patient will benefit from skilled therapeutic intervention in order to improve the following deficits and impairments:  Abnormal gait, Decreased balance, Decreased range of motion, Decreased strength, Difficulty walking, Hypomobility, Increased fascial restricitons, Increased muscle spasms, Impaired flexibility, Improper body mechanics, Postural dysfunction, Pain  Visit Diagnosis: Pain in right ankle and joints of right foot  Stiffness of right ankle, not elsewhere classified  Other symptoms and signs involving the musculoskeletal system     Problem List There are no active problems to display for this patient.       Geraldine Solar PT, [place] [address] [place], [place], [address] Phone: [phone]   Fax:   [phone]  Name: Ann Walters MRN: [id number] Date of Birth: 01-05-1961

## 2018-01-17 ENCOUNTER — Ambulatory Visit (HOSPITAL_COMMUNITY): Payer: 59

## 2018-01-17 ENCOUNTER — Encounter (HOSPITAL_COMMUNITY): Payer: Self-pay

## 2018-01-17 DIAGNOSIS — M25671 Stiffness of right ankle, not elsewhere classified: Secondary | ICD-10-CM

## 2018-01-17 DIAGNOSIS — M25571 Pain in right ankle and joints of right foot: Secondary | ICD-10-CM | POA: Diagnosis not present

## 2018-01-17 DIAGNOSIS — R29898 Other symptoms and signs involving the musculoskeletal system: Secondary | ICD-10-CM

## 2018-01-17 NOTE — Therapy (Signed)
[place], [place], [address] Phone: [phone]   Fax:  [phone]  Physical Therapy Treatment  Patient Details  Name: Ann Walters MRN: [id number] Date of Birth: 1961/02/21 Referring Provider: Corky Sing, [place]   Encounter Date: 01/17/2018  PT End of Session - 01/17/18 1117    Visit Number  14    Number of Visits  19    Date for PT Re-Evaluation  01/26/18    Authorization Type  [employer] Open Access    Authorization Time Period  11/22/17 to 01/03/18; NEW: 01/03/18 to 01/26/18    Authorization - Visit Number  14    Authorization - Number of Visits  60    PT Start Time  1848    PT Stop Time  1202    PT Time Calculation (min)  45 min    Activity Tolerance  Patient tolerated treatment well;No increased pain    Behavior During Therapy  [facility] for tasks assessed/performed       History reviewed. No pertinent past medical history.  Past Surgical History:  Procedure Laterality Date  . ABDOMINAL HYSTERECTOMY    . AUGMENTATION MAMMAPLASTY Bilateral 2011  . BREAST ENHANCEMENT SURGERY    . BREAST EXCISIONAL BIOPSY Right   . COMBINED ABDOMINOPLASTY AND LIPOSUCTION      There were no vitals filed for this visit.  Subjective Assessment - 01/17/18 1117    Subjective  Pt states that her ankle pain comes and goes still. She comes into therapy with her old work shoes on.    Currently in Pain?  Yes    Pain Score  1     Pain Location  Ankle    Pain Orientation  Right;Medial    Pain Descriptors / Indicators  Aching    Pain Type  Chronic pain    Pain Onset  More than a month ago    Pain Frequency  Constant    Aggravating Factors   walking, stairs, work activities    Pain Relieving Factors  rest    Effect of Pain on Daily Activities  increases            [facility] Adult PT Treatment/Exercise - 01/17/18 0001      Manual Therapy   Manual Therapy  Soft tissue mobilization    Manual therapy comments  Manual  complete separate than rest of tx    Soft tissue mobilization  STM R soleus and medial ankle musculature following needling      Ankle Exercises: Seated   Heel Raises  Right;Limitations    Heel Raises Limitations  x30 reps, knee bent for soleus activation      Ankle Exercises: Stretches   Soleus Stretch  3 reps;30 seconds;Limitations RLE only, slant board, knee bent    Soleus Stretch Limitations  RLE only 1x30" on step, knee bent       Trigger Point Dry Needling - 01/17/18 1141    Consent Given?  Yes    Education Handout Provided  No    Muscles Treated Lower Body  Soleus    Soleus Response  Twitch response elicited;Palpable increased muscle length          PT Education - 01/17/18 1125    Education provided  Yes    Person(s) Educated  Patient    Methods  Explanation;Demonstration    Comprehension  Verbalized understanding;Returned demonstration       PT Short Term Goals - 01/03/18 1121  PT SHORT TERM GOAL #1   Title  Pt will be independent with HEP and perform consistently in order to decrease pain and maximize return to PLOF.    Baseline  12/13/17 - do it at least 1x per day    Time  3    Period  Weeks    Status  Achieved      PT SHORT TERM GOAL #2   Title  Pt will have improved R ankle ROM by 5 deg throughout to decrease pain and maximize gait and other functional tasks.    Baseline  12/13/17 - improved by 5 degrees or more    Time  3    Period  Weeks    Status  Achieved      PT SHORT TERM GOAL #3   Title  Pt will be able to perform R SLS for 30 sec or > to be symmetrical with the LLE in order to maximize gait on uneven ground.    Baseline  12/13/17 - Bil LE 30 seconds    Time  3    Period  Weeks    Status  Achieved        PT Long Term Goals - 01/03/18 1121      PT LONG TERM GOAL #1   Title  Pt will have improved bil ankle DF ROM to 5 deg and R PF ROM to 65 deg in order to decrease pain and maximize ability to complete functional tasks with greater ease.      Baseline  3/5:  - bil DF = 3 degrees, Rigth PF = 65degrees    Time  6    Period  Weeks    Status  Partially Met      PT LONG TERM GOAL #2   Title  Pt will have improved bil hip MMT to 5/5 and have no pain with ankle MMT to demo improved overall strength and maximize function at work.    Baseline  3/5: all 5/5 throughout Bil LE, but did have pain with R ankle inversion MMT    Time  6    Period  Weeks    Status  Partially Met      PT LONG TERM GOAL #3   Title  Pt will report being able to climb ladders at work with 2/10 R ankle pain or < to demo improved ROM and functional strength of her R ankle.    Baseline  3/26: climbing ladders at work is okay until the end of the night, with pain averaging no more than 3/10.     Time  6    Period  Weeks    Status  On-going      PT LONG TERM GOAL #4   Title  Pt will be able to perform R single leg step downs from 7" step 5/5 times with no evidence of unsteadiness, proper mechanics, and no pain to demo improved ankle ROM and strength in order to maximize stair ambulation and funciton at work.    Baseline  3/26: performed 5/5 reps but demo'd increaed unsteadiness, decreased control and R ankle pain    Time  6    Period  Weeks    Status  On-going            Plan - 01/17/18 1204    Clinical Impression Statement  Pt continues with c/o R ankle pain, especially during stairs and work. PT assessed her soleus again this date and she continued with palpable  taut bands and restrictions along with tenderness to palpation. PT explained rationale to needling soleus (see last treatment note's clinical impression) and she was agreeable to needling it this date. Pt with large twitch response on initial stick and then with min-moderate twitches for remainder of dry needling (only performed 4-5 sticks since the calves are fast twitch muscles and tend to be very sore following needling). Educated pt on likelihood of increased muscle soreness and educated pt on  how to isolate soleus activation and stretching for pain control; she verbalized understanding. Ended with manual STM to soleus and other medial ankle structures as well as stretching and muscle activation for R soleus. Pt reporting 1/10 pain at EOS. Continue as planned, progressing as able.     Rehab Potential  Good    PT Frequency  2x / week    PT Duration  6 weeks    PT Treatment/Interventions  ADLs/Self Care Home Management;Cryotherapy;Electrical Stimulation;Moist Heat;Traction;Ultrasound;Gait training;Stair training;Functional mobility training;Therapeutic activities;Therapeutic exercise;Balance training;Neuromuscular re-education;Patient/family education;Manual techniques;Scar mobilization;Compression bandaging;Passive range of motion;Dry needling;Energy conservation;Taping;Splinting    PT Next Visit Plan  trigger point dry needling to R soleus (abductor hallicus, quadratus plantae, flexor digitorum brevis, flexor digitorum longus, posterior tibialis, abductor hallicus PRN); continue with standing BAPS to level 3; Continue with manual for soft tissue restrictions and pain control. Focus on eccentric inversion/PF and dynamic stabllity activites.    PT Home Exercise Plan  eval: ABCs, standing calf stretch; 11/24/2017 - foam complaint RT SLS; 2/26: knee drives on step + TB for self-ankle DF mob, isometric PF; 12/13/17 - K tap instruction, heel raises with Alipio;; 4/9: seated heel raises, soleus stretch    Consulted and Agree with Plan of Care  Patient       Patient will benefit from skilled therapeutic intervention in order to improve the following deficits and impairments:  Abnormal gait, Decreased balance, Decreased range of motion, Decreased strength, Difficulty walking, Hypomobility, Increased fascial restricitons, Increased muscle spasms, Impaired flexibility, Improper body mechanics, Postural dysfunction, Pain  Visit Diagnosis: Pain in right ankle and joints of right foot  Stiffness of right  ankle, not elsewhere classified  Other symptoms and signs involving the musculoskeletal system     Problem List There are no active problems to display for this patient.      Geraldine Solar PT, [place] [address] [place], [place], [address] Phone: [phone]   Fax:  [phone]  Name: Ann Walters MRN: [id number] Date of Birth: 1961-07-10

## 2018-01-19 ENCOUNTER — Ambulatory Visit (HOSPITAL_COMMUNITY): Payer: 59

## 2018-01-19 ENCOUNTER — Encounter (HOSPITAL_COMMUNITY): Payer: Self-pay

## 2018-01-19 DIAGNOSIS — M25671 Stiffness of right ankle, not elsewhere classified: Secondary | ICD-10-CM

## 2018-01-19 DIAGNOSIS — M25571 Pain in right ankle and joints of right foot: Secondary | ICD-10-CM

## 2018-01-19 DIAGNOSIS — R29898 Other symptoms and signs involving the musculoskeletal system: Secondary | ICD-10-CM

## 2018-01-19 NOTE — Therapy (Signed)
[place], [place], [address] Phone: [phone]   Fax:  [phone]  Physical Therapy Treatment  Patient Details  Name: Ann Walters MRN: [id number] Date of Birth: 11/14/1960 Referring Provider: Corky Sing, [place]   Encounter Date: 01/19/2018  PT End of Session - 01/19/18 0909    Visit Number  15    Number of Visits  19    Date for PT Re-Evaluation  01/26/18    Authorization Type  [employer] Open Access    Authorization Time Period  11/22/17 to 01/03/18; NEW: 01/03/18 to 01/26/18    Authorization - Visit Number  15    Authorization - Number of Visits  45    PT Start Time  0909 pt arrived late    PT Stop Time  0941    PT Time Calculation (min)  32 min    Activity Tolerance  Patient tolerated treatment well;No increased pain    Behavior During Therapy  [facility] for tasks assessed/performed       History reviewed. No pertinent past medical history.  Past Surgical History:  Procedure Laterality Date  . ABDOMINAL HYSTERECTOMY    . AUGMENTATION MAMMAPLASTY Bilateral 2011  . BREAST ENHANCEMENT SURGERY    . BREAST EXCISIONAL BIOPSY Right   . COMBINED ABDOMINOPLASTY AND LIPOSUCTION      There were no vitals filed for this visit.  Subjective Assessment - 01/19/18 0909    Subjective  Pt states that she is so much better. It is like night and day she states. She still has some twinges at her medial ankle, but overall it is much improved.    Currently in Pain?  Yes    Pain Score  -- 0.5    Pain Location  Ankle    Pain Orientation  Right;Medial    Pain Descriptors / Indicators  Aching    Pain Type  Chronic pain    Pain Onset  More than a month ago    Pain Frequency  Constant    Aggravating Factors   walking, stairs, work activities    Pain Relieving Factors  rest    Effect of Pain on Daily Activities  increases             [facility] Adult PT Treatment/Exercise - 01/19/18 0001      Manual Therapy   Manual  Therapy  Soft tissue mobilization    Manual therapy comments  Manual complete separate than rest of tx    Soft tissue mobilization  STM R soleus      Ankle Exercises: Seated   Heel Raises Limitations  2x30 reps, 10#, knee bent for soleus activation/strengthening      Ankle Exercises: Stretches   Soleus Stretch  3 reps;30 seconds;Limitations RLE only, slant board, knee bent      Ankle Exercises: Standing   SLS  RLE only, BOSU (dome up) 10x10"    Heel Raises  Right;20 reps    Other Standing Ankle Exercises  SLS on foam +palov press x15 reps          PT Education - 01/19/18 0940    Education Details  exercise technique, will perform dry needling next visit on soleus again    Person(s) Educated  Patient    Methods  Explanation;Demonstration    Comprehension  Verbalized understanding;Returned demonstration       PT Short Term Goals - 01/03/18 1121      PT SHORT TERM GOAL #1  Title  Pt will be independent with HEP and perform consistently in order to decrease pain and maximize return to PLOF.    Baseline  12/13/17 - do it at least 1x per day    Time  3    Period  Weeks    Status  Achieved      PT SHORT TERM GOAL #2   Title  Pt will have improved R ankle ROM by 5 deg throughout to decrease pain and maximize gait and other functional tasks.    Baseline  12/13/17 - improved by 5 degrees or more    Time  3    Period  Weeks    Status  Achieved      PT SHORT TERM GOAL #3   Title  Pt will be able to perform R SLS for 30 sec or > to be symmetrical with the LLE in order to maximize gait on uneven ground.    Baseline  12/13/17 - Bil LE 30 seconds    Time  3    Period  Weeks    Status  Achieved        PT Long Term Goals - 01/03/18 1121      PT LONG TERM GOAL #1   Title  Pt will have improved bil ankle DF ROM to 5 deg and R PF ROM to 65 deg in order to decrease pain and maximize ability to complete functional tasks with greater ease.     Baseline  3/5:  - bil DF = 3 degrees, Rigth  PF = 65degrees    Time  6    Period  Weeks    Status  Partially Met      PT LONG TERM GOAL #2   Title  Pt will have improved bil hip MMT to 5/5 and have no pain with ankle MMT to demo improved overall strength and maximize function at work.    Baseline  3/5: all 5/5 throughout Bil LE, but did have pain with R ankle inversion MMT    Time  6    Period  Weeks    Status  Partially Met      PT LONG TERM GOAL #3   Title  Pt will report being able to climb ladders at work with 2/10 R ankle pain or < to demo improved ROM and functional strength of her R ankle.    Baseline  3/26: climbing ladders at work is okay until the end of the night, with pain averaging no more than 3/10.     Time  6    Period  Weeks    Status  On-going      PT LONG TERM GOAL #4   Title  Pt will be able to perform R single leg step downs from 7" step 5/5 times with no evidence of unsteadiness, proper mechanics, and no pain to demo improved ankle ROM and strength in order to maximize stair ambulation and funciton at work.    Baseline  3/26: performed 5/5 reps but demo'd increaed unsteadiness, decreased control and R ankle pain    Time  6    Period  Weeks    Status  On-going            Plan - 01/19/18 0932    Clinical Impression Statement  Session limited as pt late to appointment. Pt presents to therapy with significantly improved symptoms. She states she was pretty tender and sore Tuesday afternoon and yesterday, but when she woke  up this morning it was significantly better. Began session with manual to R soleus to address the continued medial leg/ankle pain she was having and pt still with taut bands and trigger points at medial soleus, but the lateral side was much improved compared to Tuesday. Rest of session focused on soleus stretching and strengthening. Pt with verbal muscle fatigue with dynamic stability activities.     Rehab Potential  Good    PT Frequency  2x / week    PT Duration  6 weeks    PT  Treatment/Interventions  ADLs/Self Care Home Management;Cryotherapy;Electrical Stimulation;Moist Heat;Traction;Ultrasound;Gait training;Stair training;Functional mobility training;Therapeutic activities;Therapeutic exercise;Balance training;Neuromuscular re-education;Patient/family education;Manual techniques;Scar mobilization;Compression bandaging;Passive range of motion;Dry needling;Energy conservation;Taping;Splinting    PT Next Visit Plan  trigger point dry needling to R soleus; Continue with manual for soft tissue restrictions and pain control. Focus on eccentric inversion/PF and dynamic stabllity activites.    PT Home Exercise Plan  eval: ABCs, standing calf stretch; 11/24/2017 - foam complaint RT SLS; 2/26: knee drives on step + TB for self-ankle DF mob, isometric PF; 12/13/17 - K tap instruction, heel raises with Vink;; 4/9: seated heel raises, soleus stretch    Consulted and Agree with Plan of Care  Patient       Patient will benefit from skilled therapeutic intervention in order to improve the following deficits and impairments:  Abnormal gait, Decreased balance, Decreased range of motion, Decreased strength, Difficulty walking, Hypomobility, Increased fascial restricitons, Increased muscle spasms, Impaired flexibility, Improper body mechanics, Postural dysfunction, Pain  Visit Diagnosis: Pain in right ankle and joints of right foot  Stiffness of right ankle, not elsewhere classified  Other symptoms and signs involving the musculoskeletal system     Problem List There are no active problems to display for this patient.      Geraldine Solar PT, [place] [address] [place], [place], [address] Phone: [phone]   Fax:  [phone]  Name: Ann Walters MRN: [id number] Date of Birth: 08-Apr-1961

## 2018-01-24 ENCOUNTER — Ambulatory Visit (HOSPITAL_COMMUNITY): Payer: 59

## 2018-01-24 ENCOUNTER — Encounter (HOSPITAL_COMMUNITY): Payer: Self-pay

## 2018-01-24 DIAGNOSIS — M25671 Stiffness of right ankle, not elsewhere classified: Secondary | ICD-10-CM

## 2018-01-24 DIAGNOSIS — M25571 Pain in right ankle and joints of right foot: Secondary | ICD-10-CM

## 2018-01-24 DIAGNOSIS — R29898 Other symptoms and signs involving the musculoskeletal system: Secondary | ICD-10-CM

## 2018-01-24 NOTE — Therapy (Signed)
[place], [place], [address] Phone: [phone]   Fax:  [phone]  Physical Therapy Treatment  Patient Details  Name: Ann Walters MRN: [id number] Date of Birth: Feb 21, 1961 Referring Provider: Corky Sing, [place]   Encounter Date: 01/24/2018  PT End of Session - 01/24/18 1121    Visit Number  16    Number of Visits  19    Date for PT Re-Evaluation  01/26/18    Authorization Type  [employer] Open Access    Authorization Time Period  11/22/17 to 01/03/18; NEW: 01/03/18 to 01/26/18    Authorization - Visit Number  16    Authorization - Number of Visits  60    PT Start Time  1120    PT Stop Time  1201    PT Time Calculation (min)  41 min    Activity Tolerance  Patient tolerated treatment well;No increased pain    Behavior During Therapy  [facility] for tasks assessed/performed       History reviewed. No pertinent past medical history.  Past Surgical History:  Procedure Laterality Date  . ABDOMINAL HYSTERECTOMY    . AUGMENTATION MAMMAPLASTY Bilateral 2011  . BREAST ENHANCEMENT SURGERY    . BREAST EXCISIONAL BIOPSY Right   . COMBINED ABDOMINOPLASTY AND LIPOSUCTION      There were no vitals filed for this visit.  Subjective Assessment - 01/24/18 1122    Subjective  Pt states that her ankle/leg has been doing well since her last treatment session. She only has pain in WB now and states that it is only minimal pain, whereas it was hurting all the time even in NWB positions.     Currently in Pain?  No/denies    Pain Onset  More than a month ago            [facility] Adult PT Treatment/Exercise - 01/24/18 0001      Manual Therapy   Manual Therapy  Soft tissue mobilization    Manual therapy comments  Manual complete separate than rest of tx    Soft tissue mobilization  STM R soleus      Ankle Exercises: Seated   Heel Raises Limitations  x50 reps, 15#, knee bent for soleus activation/strengthening      Ankle Exercises: Stretches   Soleus Stretch  3 reps;30 seconds;Limitations RLE only, slant board, knee bent      Ankle Exercises: Standing   SLS  RLE only, BOSU (dome up) 10x10"    Other Standing Ankle Exercises  SLS on foam +palov press GTB x20 reps       Trigger Point Dry Needling - 01/24/18 1144    Consent Given?  Yes    Education Handout Provided  No    Muscles Treated Lower Body  Soleus    Soleus Response  Twitch response elicited;Palpable increased muscle length RLE             PT Short Term Goals - 01/03/18 1121      PT SHORT TERM GOAL #1   Title  Pt will be independent with HEP and perform consistently in order to decrease pain and maximize return to PLOF.    Baseline  12/13/17 - do it at least 1x per day    Time  3    Period  Weeks    Status  Achieved      PT SHORT TERM GOAL #2   Title  Pt will have improved R ankle  ROM by 5 deg throughout to decrease pain and maximize gait and other functional tasks.    Baseline  12/13/17 - improved by 5 degrees or more    Time  3    Period  Weeks    Status  Achieved      PT SHORT TERM GOAL #3   Title  Pt will be able to perform R SLS for 30 sec or > to be symmetrical with the LLE in order to maximize gait on uneven ground.    Baseline  12/13/17 - Bil LE 30 seconds    Time  3    Period  Weeks    Status  Achieved        PT Long Term Goals - 01/03/18 1121      PT LONG TERM GOAL #1   Title  Pt will have improved bil ankle DF ROM to 5 deg and R PF ROM to 65 deg in order to decrease pain and maximize ability to complete functional tasks with greater ease.     Baseline  3/5:  - bil DF = 3 degrees, Rigth PF = 65degrees    Time  6    Period  Weeks    Status  Partially Met      PT LONG TERM GOAL #2   Title  Pt will have improved bil hip MMT to 5/5 and have no pain with ankle MMT to demo improved overall strength and maximize function at work.    Baseline  3/5: all 5/5 throughout Bil LE, but did have pain with R ankle inversion  MMT    Time  6    Period  Weeks    Status  Partially Met      PT LONG TERM GOAL #3   Title  Pt will report being able to climb ladders at work with 2/10 R ankle pain or < to demo improved ROM and functional strength of her R ankle.    Baseline  3/26: climbing ladders at work is okay until the end of the night, with pain averaging no more than 3/10.     Time  6    Period  Weeks    Status  On-going      PT LONG TERM GOAL #4   Title  Pt will be able to perform R single leg step downs from 7" step 5/5 times with no evidence of unsteadiness, proper mechanics, and no pain to demo improved ankle ROM and strength in order to maximize stair ambulation and funciton at work.    Baseline  3/26: performed 5/5 reps but demo'd increaed unsteadiness, decreased control and R ankle pain    Time  6    Period  Weeks    Status  On-going            Plan - 01/24/18 1201    Clinical Impression Statement  Pt continues to present to therapy with overall significant improvements in R ankle/leg pain. She reported only minor pain at her medial lower leg. PT able to recreate pain with palpation to medial soleus. Pt agreeable to trigger point dry needling again this date. Twitch response elicited throughout needling and pt reported improved pain afterwards. Followed needling with STM to medial soleus for continued muscle relaxation and pain control. Rest of session focused on soleus activation and strengthening. Pt reporting no pain at EOS. Pt due for reassessment and likely d/c next visit due to progress made    Rehab Potential  Good  PT Frequency  2x / week    PT Duration  6 weeks    PT Treatment/Interventions  ADLs/Self Care Home Management;Cryotherapy;Electrical Stimulation;Moist Heat;Traction;Ultrasound;Gait training;Stair training;Functional mobility training;Therapeutic activities;Therapeutic exercise;Balance training;Neuromuscular re-education;Patient/family education;Manual techniques;Scar  mobilization;Compression bandaging;Passive range of motion;Dry needling;Energy conservation;Taping;Splinting    PT Next Visit Plan  reassess and likely d/c     PT Home Exercise Plan  eval: ABCs, standing calf stretch; 11/24/2017 - foam complaint RT SLS; 2/26: knee drives on step + TB for self-ankle DF mob, isometric PF; 12/13/17 - K tap instruction, heel raises with Severino;; 4/9: seated heel raises, soleus stretch    Consulted and Agree with Plan of Care  Patient       Patient will benefit from skilled therapeutic intervention in order to improve the following deficits and impairments:  Abnormal gait, Decreased balance, Decreased range of motion, Decreased strength, Difficulty walking, Hypomobility, Increased fascial restricitons, Increased muscle spasms, Impaired flexibility, Improper body mechanics, Postural dysfunction, Pain  Visit Diagnosis: Pain in right ankle and joints of right foot  Stiffness of right ankle, not elsewhere classified  Other symptoms and signs involving the musculoskeletal system     Problem List There are no active problems to display for this patient.      Geraldine Solar PT, [place] [address] [place], [place], [address] Phone: [phone]   Fax:  [phone]  Name: Ann Walters MRN: [id number] Date of Birth: Oct 14, 1960

## 2018-01-26 ENCOUNTER — Ambulatory Visit (HOSPITAL_COMMUNITY): Payer: 59

## 2018-01-26 DIAGNOSIS — M25571 Pain in right ankle and joints of right foot: Secondary | ICD-10-CM

## 2018-01-26 DIAGNOSIS — R29898 Other symptoms and signs involving the musculoskeletal system: Secondary | ICD-10-CM

## 2018-01-26 DIAGNOSIS — M25671 Stiffness of right ankle, not elsewhere classified: Secondary | ICD-10-CM

## 2018-01-26 NOTE — Therapy (Addendum)
[place], [place], [address] Phone: [phone]   Fax:  [phone]  Physical Therapy Treatment/Discharge Summary  Patient Details  Name: Ann Walters MRN: [id number] Date of Birth: 1961-08-29 Referring Provider: Corky Sing, [place]   Encounter Date: 01/26/2018  PT End of Session - 01/26/18 0902    Visit Number  17    Number of Visits  19    Date for PT Re-Evaluation  01/26/18    Authorization Type  [employer] Open Access    Authorization Time Period  11/22/17 to 01/03/18; NEW: 01/03/18 to 01/26/18    Authorization - Visit Number  17    Authorization - Number of Visits  60    PT Start Time  0902    PT Stop Time  0918    PT Time Calculation (min)  16 min    Activity Tolerance  Patient tolerated treatment well;No increased pain    Behavior During Therapy  [facility] for tasks assessed/performed       No past medical history on file.  Past Surgical History:  Procedure Laterality Date  . ABDOMINAL HYSTERECTOMY    . AUGMENTATION MAMMAPLASTY Bilateral 2011  . BREAST ENHANCEMENT SURGERY    . BREAST EXCISIONAL BIOPSY Right   . COMBINED ABDOMINOPLASTY AND LIPOSUCTION      There were no vitals filed for this visit.  Subjective Assessment - 01/26/18 0902    Subjective  Pt states that she's doing good. She reports minor soreness following the needling but overall, she's doing well.     Currently in Pain?  No/denies    Pain Onset  More than a month ago         [facility] PT Assessment - 01/26/18 0001      Assessment   Medical Diagnosis  Posterior tibialis tendonitis    Referring Provider  Corky Sing, PA-C    Onset Date/Surgical Date  07/22/17    Next MD Visit  01/30/18    Prior Therapy  yes for outer ankle      Functional Tests   Functional tests  Step down      Step Down   Comments  single leg step down on 7" step: much improved stability per gross assessment, though pt feels it's still unsteady. Not  nearly as much pain as before      AROM   Right Ankle Dorsiflexion  5 was 3    Left Ankle Dorsiflexion  5 was 3      Strength   Right Ankle Inversion  5/5 non-painful this date; was painful          PT Education - 01/26/18 0923    Education provided  Yes    Education Details  discharged to HEP    Person(s) Educated  Patient    Methods  Explanation    Comprehension  Verbalized understanding         PT Short Term Goals - 01/26/18 0904      PT SHORT TERM GOAL #1   Title  Pt will be independent with HEP and perform consistently in order to decrease pain and maximize return to PLOF.    Baseline  12/13/17 - do it at least 1x per day    Time  3    Period  Weeks    Status  Achieved      PT SHORT TERM GOAL #2   Title  Pt will have improved R ankle ROM by  5 deg throughout to decrease pain and maximize gait and other functional tasks.    Baseline  12/13/17 - improved by 5 degrees or more    Time  3    Period  Weeks    Status  Achieved      PT SHORT TERM GOAL #3   Title  Pt will be able to perform R SLS for 30 sec or > to be symmetrical with the LLE in order to maximize gait on uneven ground.    Baseline  12/13/17 - Bil LE 30 seconds    Time  3    Period  Weeks    Status  Achieved        PT Long Term Goals - 01/26/18 0037      PT LONG TERM GOAL #1   Title  Pt will have improved bil ankle DF ROM to 5 deg and R PF ROM to 65 deg in order to decrease pain and maximize ability to complete functional tasks with greater ease.     Baseline  4/18: bil DF 5 deg, R PF 65 deg    Time  6    Period  Weeks    Status  Achieved      PT LONG TERM GOAL #2   Title  Pt will have improved bil hip MMT to 5/5 and have no pain with ankle MMT to demo improved overall strength and maximize function at work.    Baseline  4/18: 5/5 thorughout, no pain with R ankle inversion MMT    Time  6    Period  Weeks    Status  Achieved      PT LONG TERM GOAL #3   Title  Pt will report being able to climb  ladders at work with 2/10 R ankle pain or < to demo improved ROM and functional strength of her R ankle.    Baseline  4/18: climbing ladders is fine, she's not near as sore at the end of the night, with pain still averaging about 3/10    Time  6    Period  Weeks    Status  Partially Met      PT LONG TERM GOAL #4   Title  Pt will be able to perform R single leg step downs from 7" step 5/5 times with no evidence of unsteadiness, proper mechanics, and no pain to demo improved ankle ROM and strength in order to maximize stair ambulation and funciton at work.    Baseline  4/18: much improved stability per gross assessment, though pt feels it's still unsteady. Not nearly as much pain as before.    Time  6    Period  Weeks    Status  Partially Met            Plan - 01/26/18 0488    Clinical Impression Statement  PT reassessed pt's goals and outcome measures this date. She has made tremendous improvements overall since her initial evaluation. Pt has now met 2 and partially met 2 LTG. Overall, she states she feels 85-90% improved. Her only remaining limitations are that she sometimes has slight increased pain at night and every now and then when she steps funny she gets a sharp pain up her leg but it goes away immediately. PT educated pt that she can begin to return to her regular exercise routine, but to slowly progress back into it so as to not increase her risk for re-injury and she verbalized understanding. At  the time, there is no more skilled therapy required due to progress made and pt is discharged to her HEP.    Rehab Potential  Good    PT Frequency  2x / week    PT Duration  6 weeks    PT Treatment/Interventions  ADLs/Self Care Home Management;Cryotherapy;Electrical Stimulation;Moist Heat;Traction;Ultrasound;Gait training;Stair training;Functional mobility training;Therapeutic activities;Therapeutic exercise;Balance training;Neuromuscular re-education;Patient/family education;Manual  techniques;Scar mobilization;Compression bandaging;Passive range of motion;Dry needling;Energy conservation;Taping;Splinting    PT Next Visit Plan  discharged to HEP    PT Home Exercise Plan  eval: ABCs, standing calf stretch; 11/24/2017 - foam complaint RT SLS; 2/26: knee drives on step + TB for self-ankle DF mob, isometric PF; 12/13/17 - K tap instruction, heel raises with [place];; 4/9: seated heel raises, soleus stretch    Consulted and Agree with Plan of Care  Patient       Patient will benefit from skilled therapeutic intervention in order to improve the following deficits and impairments:  Abnormal gait, Decreased balance, Decreased range of motion, Decreased strength, Difficulty walking, Hypomobility, Increased fascial restricitons, Increased muscle spasms, Impaired flexibility, Improper body mechanics, Postural dysfunction, Pain  Visit Diagnosis: Pain in right ankle and joints of right foot  Stiffness of right ankle, not elsewhere classified  Other symptoms and signs involving the musculoskeletal system     Problem List There are no active problems to display for this patient.   PHYSICAL THERAPY DISCHARGE SUMMARY  Visits from Start of Care: 17  Current functional level related to goals / functional outcomes: See above   Remaining deficits: See above   Education / Equipment: HEP Plan: Patient agrees to discharge.  Patient goals were met. Patient is being discharged due to meeting the stated rehab goals.  ?????       Geraldine Solar PT, [place] [address] [place], [place], [address] Phone: [phone]   Fax:  [phone]  Name: ELEANORE JUNIO MRN: [id number] Date of Birth: 04-05-1961

## 2021-02-06 ENCOUNTER — Other Ambulatory Visit: Payer: Self-pay | Admitting: Internal Medicine

## 2021-02-06 DIAGNOSIS — Z1231 Encounter for screening mammogram for malignant neoplasm of breast: Secondary | ICD-10-CM

## 2021-04-01 ENCOUNTER — Ambulatory Visit
Admission: RE | Admit: 2021-04-01 | Discharge: 2021-04-01 | Disposition: A | Discharge status: Home / Self Care | Payer: 59 | Source: Ambulatory Visit | Attending: Internal Medicine | Admitting: Internal Medicine

## 2021-04-01 ENCOUNTER — Other Ambulatory Visit: Payer: Self-pay

## 2021-04-01 DIAGNOSIS — Z1231 Encounter for screening mammogram for malignant neoplasm of breast: Secondary | ICD-10-CM

## 2021-10-24 IMAGING — MG DIGITAL SCREENING BREAST BILAT IMPLANT W/ TOMO W/ CAD
9 of 16 series · 9 of 40 positions shown · non-contrast
Comparison: Previous exam(s).

CLINICAL DATA: Screening.

EXAM:
DIGITAL SCREENING BILATERAL MAMMOGRAM WITH IMPLANTS, CAD AND
TOMOSYNTHESIS
TECHNIQUE: Bilateral screening digital craniocaudal and mediolateral oblique
mammograms were obtained. Bilateral screening digital breast
tomosynthesis was performed. The images were evaluated with
computer-aided detection. Standard and/or implant displaced views
were performed.

[L CC]
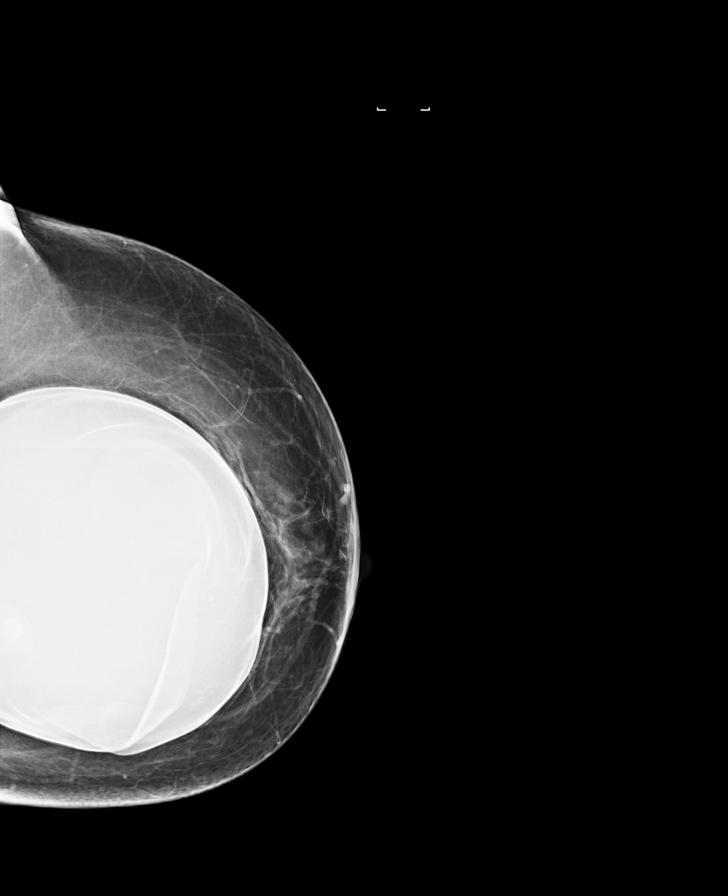

[L MLO]
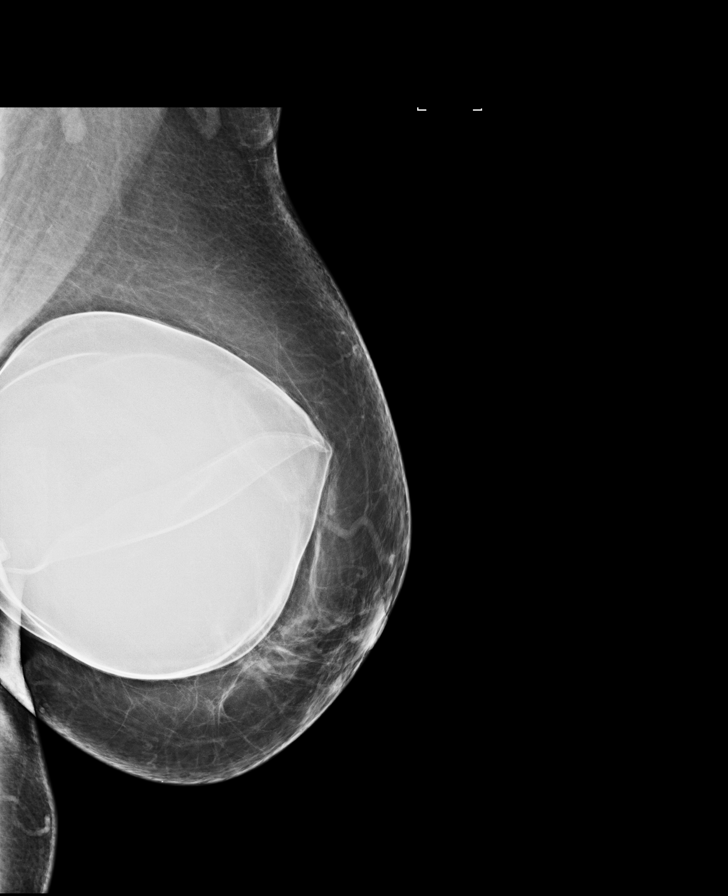

[R MLO]
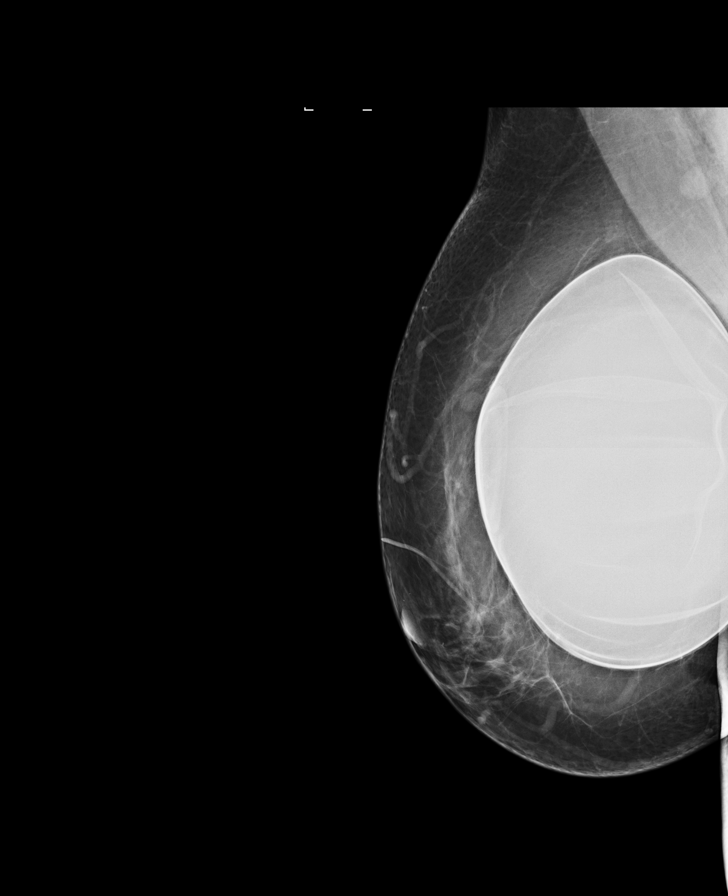

[R CC]
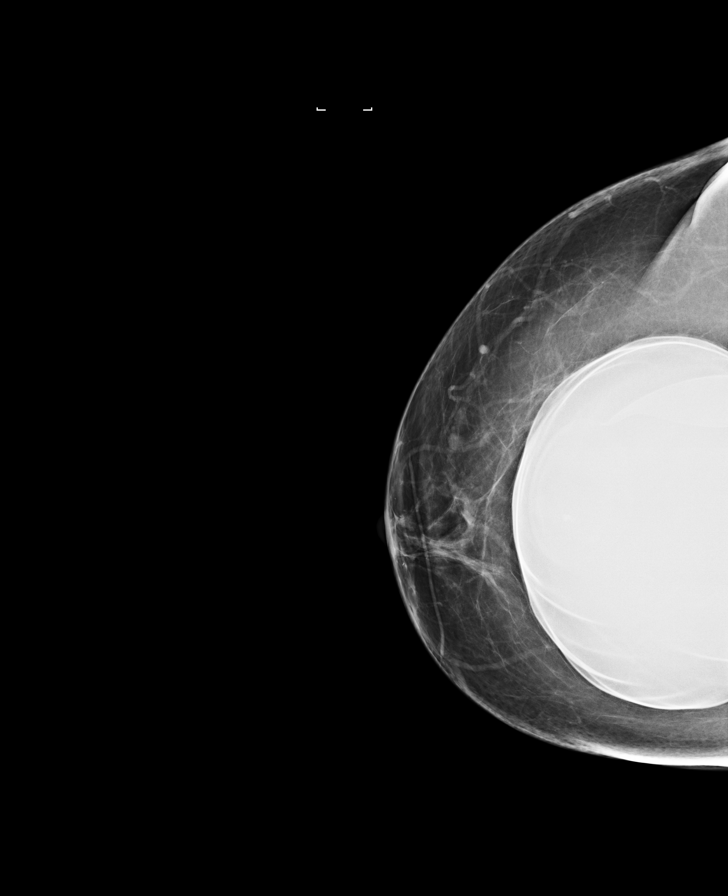

[L MLO synth-2D]
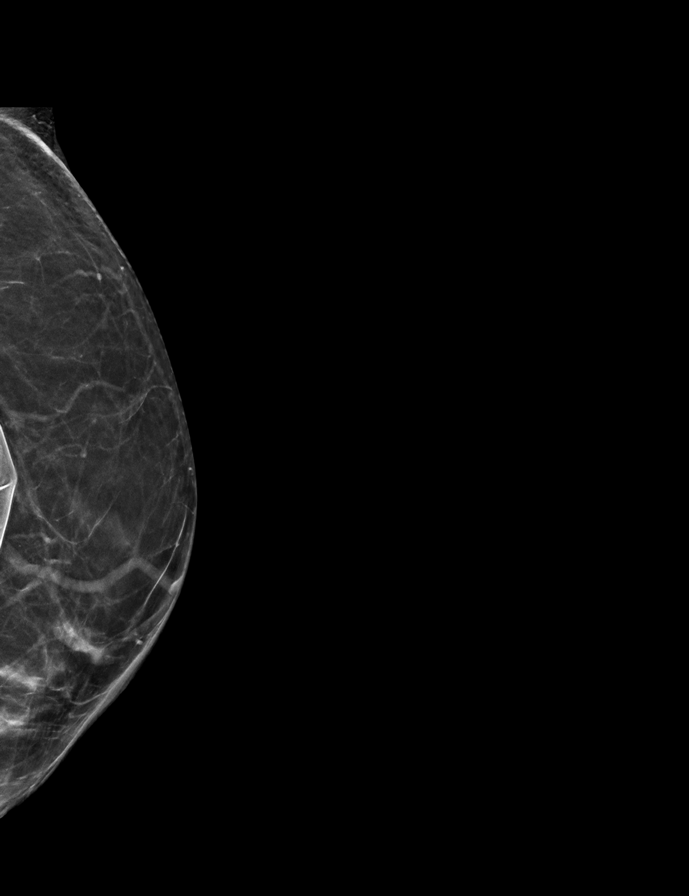

[R MLO synth-2D (1 of 2)]
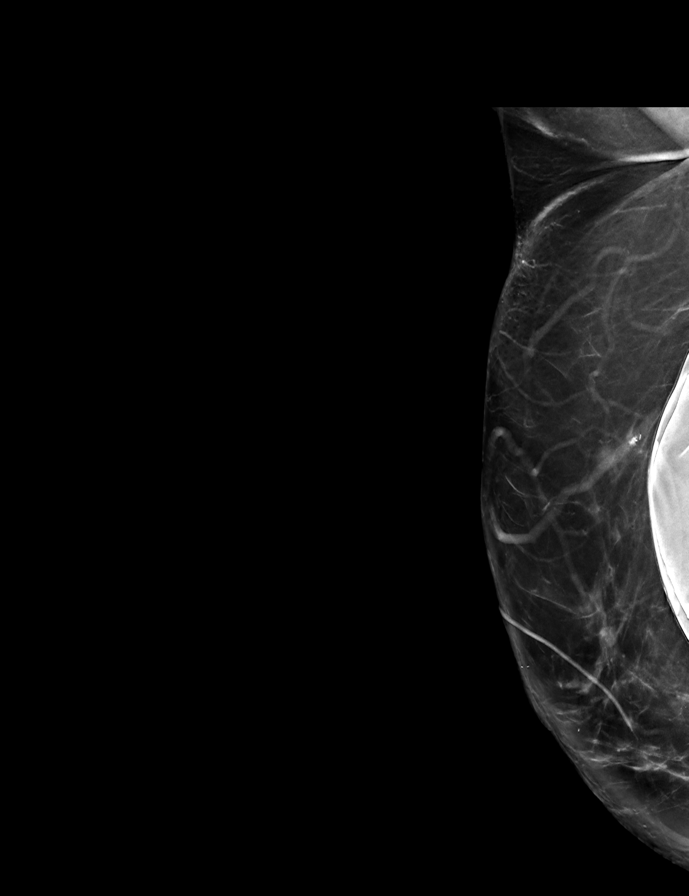

[R MLO synth-2D (2 of 2)]
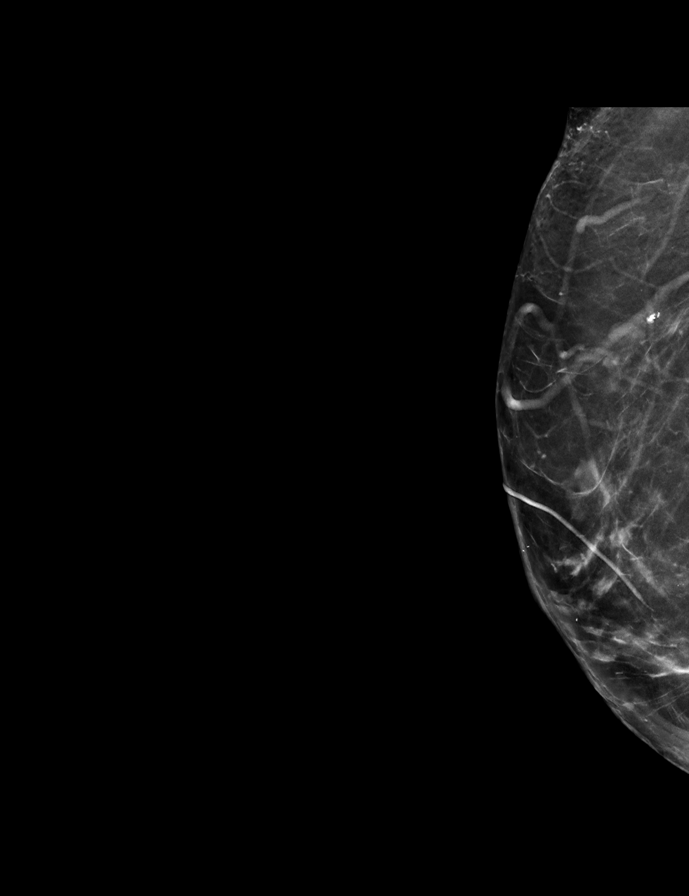

[L CC synth-2D]
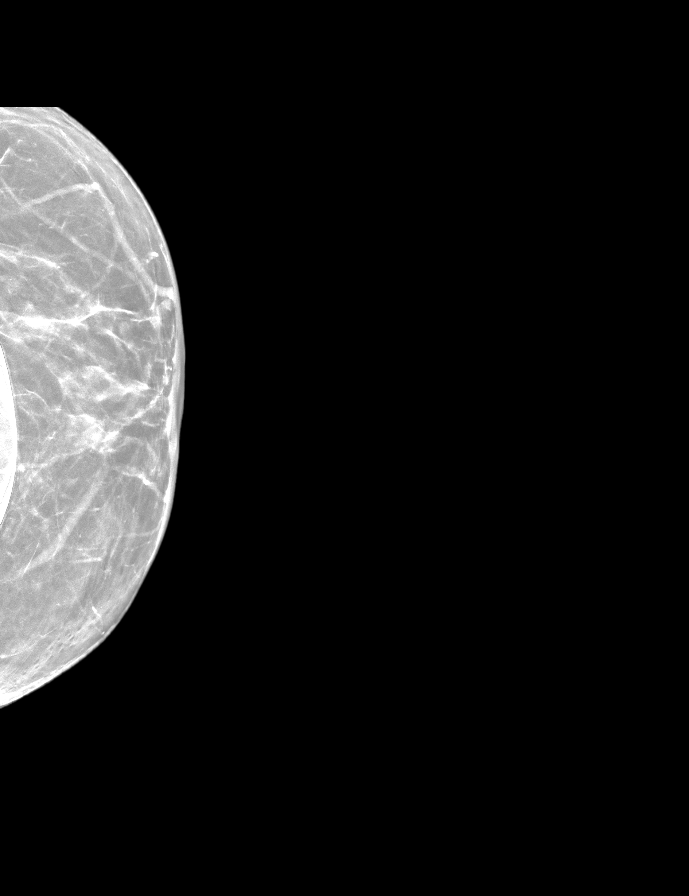

[R CC synth-2D]
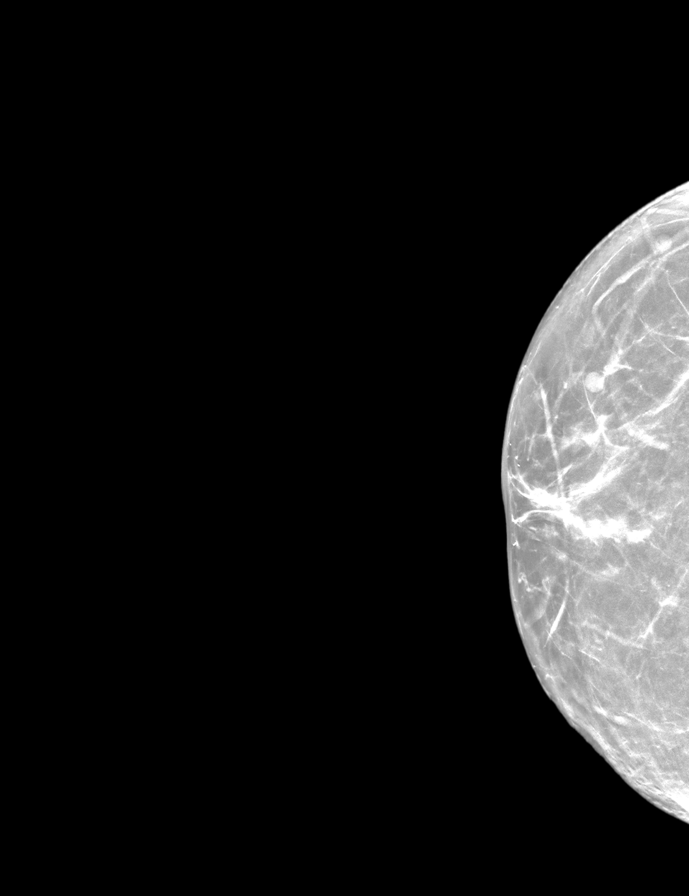

[9 of 40 positions shown; findings below may reference images not displayed]

ACR Breast Density Category b: There are scattered areas of
fibroglandular density.
FINDINGS: The patient has retroglandular implants. There are no findings
suspicious for malignancy.
IMPRESSION: No mammographic evidence of malignancy. A result letter of this
screening mammogram will be mailed directly to the patient.

RECOMMENDATION:
Screening mammogram in one year. (Code:ST-V-2PT)

BI-RADS CATEGORY  1:  Negative.

## 2021-11-05 ENCOUNTER — Ambulatory Visit (HOSPITAL_COMMUNITY): Payer: 59 | Attending: Physician Assistant | Admitting: Physical Therapy

## 2021-11-05 ENCOUNTER — Encounter (HOSPITAL_COMMUNITY): Payer: Self-pay | Admitting: Physical Therapy

## 2021-11-05 ENCOUNTER — Other Ambulatory Visit: Payer: Self-pay

## 2021-11-05 DIAGNOSIS — R29898 Other symptoms and signs involving the musculoskeletal system: Secondary | ICD-10-CM | POA: Insufficient documentation

## 2021-11-05 DIAGNOSIS — M6281 Muscle weakness (generalized): Secondary | ICD-10-CM | POA: Insufficient documentation

## 2021-11-05 DIAGNOSIS — M546 Pain in thoracic spine: Secondary | ICD-10-CM | POA: Insufficient documentation

## 2021-11-05 DIAGNOSIS — R2689 Other abnormalities of gait and mobility: Secondary | ICD-10-CM | POA: Insufficient documentation

## 2021-11-05 NOTE — Therapy (Signed)
[facility] [address] [place], [place], [address] Phone: [phone]   Fax:  [phone]  Physical Therapy Evaluation  Patient Details  Name: Ann Walters MRN: [id number] Date of Birth: 09/18/1961 Referring Provider (PT): Eliane Decreelivia Clelland PA   Encounter Date: 11/05/2021   PT End of Session - 11/05/21 1439     Visit Number 1    Number of Visits 6    Date for PT Re-Evaluation 12/17/21    Authorization Type [employer] (no auth, 60 VL)    PT Start Time 1405    PT Stop Time 1440    PT Time Calculation (min) 35 min    Activity Tolerance Patient tolerated treatment well    Behavior [facility] tasks assessed/performed             History reviewed. No pertinent past medical history.  Past Surgical History:  Procedure Laterality Date   ABDOMINAL HYSTERECTOMY     AUGMENTATION MAMMAPLASTY Bilateral 2011   BREAST ENHANCEMENT SURGERY     BREAST EXCISIONAL BIOPSY Right    COMBINED ABDOMINOPLASTY AND LIPOSUCTION      There were no vitals filed for this visit.    Subjective Assessment - 11/05/21 1409     Subjective Patient is a 61 y.o. female who presents to physical therapy with c/o back pain. 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 She does have L sided back and rib pain. 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 Patient states her main goal is to strengthen her back to keep healthy. Heat and muscle relaxor have been helping to decrease. She wants to get some exercises to get stronger.    Limitations Lifting    Patient Stated Goals strengthen her back to keep healthy.    Currently in Pain? No/denies                [facility] - 11/05/21 0001       Assessment   Medical Diagnosis mid back pain on L side    Referring Provider (PT) Eliane Decreelivia Clelland PA    Onset Date/Surgical Date 07/11/21    Hand Dominance Right     Next MD Visit March      Precautions   Precautions None      Restrictions   Weight Bearing Restrictions No      Balance Screen   Has the patient fallen in the past 6 months No    Has the patient had a decrease in activity level because of a fear of falling?  No    Is the patient reluctant to leave their home because of a fear of falling?  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      Cognition   Overall Cognitive Status Within Functional Limits for tasks assessed      Observation/Other Assessments   Observations Ambulates without    Focus on Therapeutic Outcomes (FOTO)  n/a      ROM / Strength   AROM / PROM / Strength AROM;Strength      AROM   AROM Assessment Site Lumbar    Lumbar Flexion 0% limited    Lumbar Extension 0% limited, felt good    Lumbar - Right Side Bend 0%  limited, felt good    Lumbar - Left Side Bend 0 % limited      Strength   Strength Assessment Site Hip;Shoulder;Elbow;Knee    Right/Left Shoulder Right;Left    Right Shoulder Flexion 5/5    Right Shoulder ABduction 5/5    Left Shoulder Flexion 5/5    Left Shoulder ABduction 5/5    Right/Left Elbow Right;Left    Right Elbow Flexion 5/5    Right Elbow Extension 5/5    Left Elbow Flexion 5/5    Left Elbow Extension 5/5    Right/Left Hip Right;Left    Right Hip Flexion 4+/5    Left Hip Flexion 4+/5    Right/Left Knee Right;Left    Right Knee Flexion 5/5    Right Knee Extension 5/5    Left Knee Flexion 5/5    Left Knee Extension 5/5      Palpation   Palpation comment TTP L lats, lower trap, periscap                        Objective measurements completed on examination: See above findings.       [facility] Adult PT Treatment/Exercise - 11/05/21 0001       Exercises   Exercises Lumbar      Lumbar Exercises: Seated   Other Seated Lumbar Exercises overhead stretch for lats 3 x  20 seconds      Manual Therapy   Manual Therapy Soft tissue mobilization    Manual therapy comments Manual complete separate than rest of tx    Soft tissue mobilization Self STM with [place] to lats and periscap L                     PT Education - 11/05/21 1406     Education Details Patient educated on exam findings, POC, scope of PT, HEP    Person(s) Educated Patient    Methods Explanation;Demonstration;Handout    Comprehension Verbalized understanding;Returned demonstration              PT Short Term Goals - 11/05/21 1449       PT SHORT TERM GOAL #1   Title Patient will be independent with HEP in order to improve functional outcomes.    Time 3    Period Weeks    Status New    Target Date 11/26/21      PT SHORT TERM GOAL #2   Title Patient will report at least 25% improvement in symptoms for improved quality of life.    Time 3    Period Weeks    Status New    Target Date 11/26/21               PT Long Term Goals - 11/05/21 1450       PT LONG TERM GOAL #1   Title Patient will report at least 75% improvement in symptoms for improved quality of life.    Time 6    Period Weeks    Status New    Target Date 12/17/21      PT LONG TERM GOAL #2   Title Patient will be able to return to all activities unrestricted for improved ability to perform work functions and participate with family.    Time 6    Period Weeks    Status New    Target Date 12/17/21      PT LONG TERM GOAL #3   Title Patient will be able  to demonstrate good lifting mechanics in order to reduce stress on spine at work.    Time 6    Period Weeks    Status New    Target Date 12/17/21                    Plan - 11/05/21 1440     Clinical Impression Statement Patient is a 61 y.o. female who presents to physical therapy with c/o back pain. She presents with pain limited deficits in lumbar/thoracic/periscapular strength, ROM, hyperactive and tender musculature and  functional mobility with ADL. She is having to modify and restrict ADL as indicated by subjective information and objective measures which is affecting overall participation. Patient will begain with mobility exercises to decrease symptoms and will progress to strengthening and body mechanics over coming weeks. Patient will benefit from skilled physical therapy in order to improve function and reduce impairment.    Personal Factors and Comorbidities Age;Profession;Time since onset of injury/illness/exacerbation    Examination-Activity Limitations Lift;Squat;Bend;Reach Overhead    Examination-Participation Restrictions Occupation;Cleaning;Yard Work;Laundry;Community Activity    Stability/Clinical Decision Making Stable/Uncomplicated    Clinical Decision Making Low    Rehab Potential Good    PT Frequency 1x / week    PT Duration 6 weeks    PT Treatment/Interventions ADLs/Self Care Home Management;Aquatic Therapy;Cryotherapy;Electrical Stimulation;Moist Heat;Traction;DME Instruction;Gait training;Stair training;Functional mobility training;Therapeutic activities;Therapeutic exercise;Balance training;Neuromuscular re-education;Patient/family education;Orthotic Fit/Training;Manual techniques;Scar mobilization;Passive range of motion;Dry needling;Energy conservation;Spinal Manipulations;Joint Manipulations    PT Next Visit Plan f/u with HEP, trunk and UE strength, overhead lifting, box lifting; possibly DN if needed    PT Home Exercise Plan 1/26 self STM, overhead stretch    Consulted and Agree with Plan of Care Patient             Patient will benefit from skilled therapeutic intervention in order to improve the following deficits and impairments:  Decreased range of motion, Decreased activity tolerance, Decreased mobility, Decreased strength, Improper body mechanics, Impaired flexibility, Pain  Visit Diagnosis: Pain in thoracic spine  Muscle weakness (generalized)  Other abnormalities of gait  and mobility  Other symptoms and signs involving the musculoskeletal system     Problem List There are no problems to display for this patient.  2:53 PM, 11/05/21 Wyman Songster PT, DPT Physical Therapist at [facility]   [facility] [address] [place], [place], [address] Phone: [phone]   Fax:  [phone]  Name: Ann Walters MRN: [id number] Date of Birth: 10-20-1960

## 2021-11-05 NOTE — Patient Instructions (Signed)
Access Code: [email or web address] URL: [email or web address] Date: 11/05/2021 Prepared by: Greig Castilla Hila Bolding  Exercises Standing massage with [place] at wall on low back - 1 x daily - 7 x weekly Seated Alternating Side Stretch with Arm Overhead - 1 x daily - 7 x weekly - 3 reps - 20-30 second hold

## 2021-11-10 ENCOUNTER — Other Ambulatory Visit: Payer: Self-pay

## 2021-11-10 ENCOUNTER — Encounter (HOSPITAL_COMMUNITY): Payer: Self-pay | Admitting: Physical Therapy

## 2021-11-10 ENCOUNTER — Ambulatory Visit (HOSPITAL_COMMUNITY): Payer: 59 | Admitting: Physical Therapy

## 2021-11-10 DIAGNOSIS — R2689 Other abnormalities of gait and mobility: Secondary | ICD-10-CM

## 2021-11-10 DIAGNOSIS — M6281 Muscle weakness (generalized): Secondary | ICD-10-CM

## 2021-11-10 DIAGNOSIS — M546 Pain in thoracic spine: Secondary | ICD-10-CM | POA: Diagnosis not present

## 2021-11-10 DIAGNOSIS — R29898 Other symptoms and signs involving the musculoskeletal system: Secondary | ICD-10-CM

## 2021-11-10 NOTE — Therapy (Signed)
[facility] [address] [place], [place], [address] Phone: [phone]   Fax:  [phone]  Physical Therapy Treatment  Patient Details  Name: Ann Walters MRN: [id number] Date of Birth: Dec 13, 1960 Referring Provider (PT): Eliane Decree PA   Encounter Date: 11/10/2021   PT End of Session - 11/10/21 1605     Visit Number 2    Number of Visits 6    Date for PT Re-Evaluation 12/17/21    Authorization Type [employer] (no auth, 60 VL)    PT Start Time 1600    PT Stop Time 1645    PT Time Calculation (min) 45 min    Activity Tolerance Patient tolerated treatment well    Behavior During Therapy [facility] for tasks assessed/performed             History reviewed. No pertinent past medical history.  Past Surgical History:  Procedure Laterality Date   ABDOMINAL HYSTERECTOMY     AUGMENTATION MAMMAPLASTY Bilateral 2011   BREAST ENHANCEMENT SURGERY     BREAST EXCISIONAL BIOPSY Right    COMBINED ABDOMINOPLASTY AND LIPOSUCTION      There were no vitals filed for this visit.   Subjective Assessment - 11/10/21 1603     Subjective Pain is better. Has been compliant with HEP. Hasnt had to lift any heavy boxes. Still having muscle tension at LT periscapular area.    Limitations Lifting    Patient Stated Goals strengthen her back to keep healthy.    Currently in Pain? Yes    Pain Score 1     Pain Location Shoulder    Pain Orientation Left    Pain Descriptors / Indicators Aching                               [facility] Adult PT Treatment/Exercise - 11/10/21 0001       Lumbar Exercises: Stretches   Other Lumbar Stretch Exercise open boox t spine rotation 5 x 10", quadruped t spine extension 5 x 10"      Lumbar Exercises: Standing   Other Standing Lumbar Exercises band row GTB x20, extension GTB x 20, GTB band ER 10 x 5", GTB band horiz abduction 10 x5"      Lumbar Exercises: Prone   Other Prone Lumbar Exercises chin  tuck 10 x 5", scap retraction 10 x 5"      Manual Therapy   Manual Therapy Soft tissue mobilization;Joint mobilization    Manual therapy comments Manual complete separate than rest of tx    Joint Mobilization Grade I-II PA mobs to thoracic spine    Soft tissue mobilization STM to LT periscap, rhomboids, patient in prone                       PT Short Term Goals - 11/05/21 1449       PT SHORT TERM GOAL #1   Title Patient will be independent with HEP in order to improve functional outcomes.    Time 3    Period Weeks    Status New    Target Date 11/26/21      PT SHORT TERM GOAL #2   Title Patient will report at least 25% improvement in symptoms for improved quality of life.    Time 3    Period Weeks    Status New    Target Date 11/26/21  PT Long Term Goals - 11/05/21 1450       PT LONG TERM GOAL #1   Title Patient will report at least 75% improvement in symptoms for improved quality of life.    Time 6    Period Weeks    Status New    Target Date 12/17/21      PT LONG TERM GOAL #2   Title Patient will be able to return to all activities unrestricted for improved ability to perform work functions and participate with family.    Time 6    Period Weeks    Status New    Target Date 12/17/21      PT LONG TERM GOAL #3   Title Patient will be able to demonstrate good lifting mechanics in order to reduce stress on spine at work.    Time 6    Period Weeks    Status New    Target Date 12/17/21                   Plan - 11/10/21 1654     Clinical Impression Statement Patient tolerated session well overall. Progressed thoracic mobility and scapular strengthening. Likely a cervical component as patient noting referred pain to target are with chin tucks. Not much knotting noted at target area, by does have palpable trigger point about medial LT scapular border. Educated patient on purpose and function of additional exercises and issued  updated HEP handout. Patient will continue to benefit from skilled therapy services to reduce deficits and improve functional level.    Personal Factors and Comorbidities Age;Profession;Time since onset of injury/illness/exacerbation    Examination-Activity Limitations Lift;Squat;Bend;Reach Overhead    Examination-Participation Restrictions Occupation;Cleaning;Yard Work;Laundry;Community Activity    Stability/Clinical Decision Making Stable/Uncomplicated    Rehab Potential Good    PT Frequency 1x / week    PT Duration 6 weeks    PT Treatment/Interventions ADLs/Self Care Home Management;Aquatic Therapy;Cryotherapy;Electrical Stimulation;Moist Heat;Traction;DME Instruction;Gait training;Stair training;Functional mobility training;Therapeutic activities;Therapeutic exercise;Balance training;Neuromuscular re-education;Patient/family education;Orthotic Fit/Training;Manual techniques;Scar mobilization;Passive range of motion;Dry needling;Energy conservation;Spinal Manipulations;Joint Manipulations    PT Next Visit Plan Trunk and UE strength, overhead lifting, box lifting; possibly DN if needed    PT Home Exercise Plan 1/26 self STM, overhead stretch 1/31  [email or web address]    Consulted and Agree with Plan of Care Patient             Patient will benefit from skilled therapeutic intervention in order to improve the following deficits and impairments:  Decreased range of motion, Decreased activity tolerance, Decreased mobility, Decreased strength, Improper body mechanics, Impaired flexibility, Pain  Visit Diagnosis: Pain in thoracic spine  Muscle weakness (generalized)  Other abnormalities of gait and mobility  Other symptoms and signs involving the musculoskeletal system     Problem List There are no problems to display for this patient.  4:55 PM, 11/10/21 Georges Lynch PT DPT  Physical Therapist with [facility]  [facility]  [phone]  [facility] [address] [place], [place], [address] Phone: [phone]   Fax:  [phone]  Name: Ann Walters MRN: [id number] Date of Birth: 1960/11/30

## 2021-11-10 NOTE — Patient Instructions (Signed)
Access Code: [email or web address] URL: [email or web address] Date: 11/10/2021 Prepared by: Georges Lynch  Exercises Sidelying Open Book Thoracic Lumbar Rotation and Extension - 1-2 x daily - 7 x weekly - 1 sets - 5 reps - 10 second hold Seated Cervical Retraction - 1-2 x daily - 7 x weekly - 1 sets - 15 reps - 5 second hold Quadruped Thoracic Spine Extension - 1-2 x daily - 7 x weekly - 1 sets - 5 reps - 10 second hold Prone Cervical Retraction - 1-2 x daily - 7 x weekly - 1 sets - 15 reps - 5 second hold Prone Scapular Slide with Shoulder Extension - 1-2 x daily - 7 x weekly - 1 sets - 15 reps - 5 second hold

## 2021-11-19 ENCOUNTER — Encounter (HOSPITAL_COMMUNITY): Payer: Self-pay | Admitting: Physical Therapy

## 2021-11-19 ENCOUNTER — Other Ambulatory Visit: Payer: Self-pay

## 2021-11-19 ENCOUNTER — Ambulatory Visit (HOSPITAL_COMMUNITY): Payer: 59 | Attending: Physician Assistant | Admitting: Physical Therapy

## 2021-11-19 DIAGNOSIS — R2689 Other abnormalities of gait and mobility: Secondary | ICD-10-CM | POA: Insufficient documentation

## 2021-11-19 DIAGNOSIS — M6281 Muscle weakness (generalized): Secondary | ICD-10-CM | POA: Insufficient documentation

## 2021-11-19 DIAGNOSIS — M546 Pain in thoracic spine: Secondary | ICD-10-CM | POA: Insufficient documentation

## 2021-11-19 DIAGNOSIS — R29898 Other symptoms and signs involving the musculoskeletal system: Secondary | ICD-10-CM | POA: Diagnosis present

## 2021-11-19 NOTE — Therapy (Signed)
[facility] [address] [place], [place], [address] Phone: [phone]   Fax:  [phone]  Physical Therapy Treatment  Patient Details  Name: SARRINAH GARDIN MRN: [id number] Date of Birth: Jan 15, 1961 Referring Provider (PT): Eliane Decree PA   Encounter Date: 11/19/2021   PT End of Session - 11/19/21 0758     Visit Number 3    Number of Visits 6    Date for PT Re-Evaluation 12/17/21    Authorization Type [employer] (no auth, 60 VL)    PT Start Time 0758   arrives late   PT Stop Time 0826    PT Time Calculation (min) 28 min    Activity Tolerance Patient tolerated treatment well    Behavior During Therapy [facility] for tasks assessed/performed             History reviewed. No pertinent past medical history.  Past Surgical History:  Procedure Laterality Date   ABDOMINAL HYSTERECTOMY     AUGMENTATION MAMMAPLASTY Bilateral 2011   BREAST ENHANCEMENT SURGERY     BREAST EXCISIONAL BIOPSY Right    COMBINED ABDOMINOPLASTY AND LIPOSUCTION      There were no vitals filed for this visit.   Subjective Assessment - 11/19/21 0759     Subjective Patient states back/shoulder is doing much better. Manual helped a lot and she felt better that night. Has been working on exercises.    Limitations Lifting    Patient Stated Goals strengthen her back to keep healthy.    Currently in Pain? No/denies                               [facility] Adult PT Treatment/Exercise - 11/19/21 0001       Lumbar Exercises: Standing   Other Standing Lumbar Exercises band row BTB 2x20, extension BTB 2 x 20, BTB band ER 2x 10 x 5", BTB band horiz abduction 2x 10 x5"      Lumbar Exercises: Seated   Other Seated Lumbar Exercises t/sp extension over chair 10 x 5 second holds                     PT Education - 11/19/21 0759     Education Details HEP    Person(s) Educated Patient    Methods Explanation;Demonstration    Comprehension  Verbalized understanding;Returned demonstration              PT Short Term Goals - 11/05/21 1449       PT SHORT TERM GOAL #1   Title Patient will be independent with HEP in order to improve functional outcomes.    Time 3    Period Weeks    Status New    Target Date 11/26/21      PT SHORT TERM GOAL #2   Title Patient will report at least 25% improvement in symptoms for improved quality of life.    Time 3    Period Weeks    Status New    Target Date 11/26/21               PT Long Term Goals - 11/05/21 1450       PT LONG TERM GOAL #1   Title Patient will report at least 75% improvement in symptoms for improved quality of life.    Time 6    Period Weeks    Status New    Target Date 12/17/21  PT LONG TERM GOAL #2   Title Patient will be able to return to all activities unrestricted for improved ability to perform work functions and participate with family.    Time 6    Period Weeks    Status New    Target Date 12/17/21      PT LONG TERM GOAL #3   Title Patient will be able to demonstrate good lifting mechanics in order to reduce stress on spine at work.    Time 6    Period Weeks    Status New    Target Date 12/17/21                   Plan - 11/19/21 0759     Clinical Impression Statement Session limited by patients late arrival. Continued with resisted postural and periscap strengthening. Intermittent cueing for posture/cervical retraction with good carry over. Patient tolerates session well without increase in symptoms.  Patient will continue to benefit from physical therapy in order to reduce impairment and improve function.    Personal Factors and Comorbidities Age;Profession;Time since onset of injury/illness/exacerbation    Examination-Activity Limitations Lift;Squat;Bend;Reach Overhead    Examination-Participation Restrictions Occupation;Cleaning;Yard Work;Laundry;Community Activity    Stability/Clinical Decision Making  Stable/Uncomplicated    Rehab Potential Good    PT Frequency 1x / week    PT Duration 6 weeks    PT Treatment/Interventions ADLs/Self Care Home Management;Aquatic Therapy;Cryotherapy;Electrical Stimulation;Moist Heat;Traction;DME Instruction;Gait training;Stair training;Functional mobility training;Therapeutic activities;Therapeutic exercise;Balance training;Neuromuscular re-education;Patient/family education;Orthotic Fit/Training;Manual techniques;Scar mobilization;Passive range of motion;Dry needling;Energy conservation;Spinal Manipulations;Joint Manipulations    PT Next Visit Plan Trunk and UE strength, overhead lifting, box lifting; possibly DN if needed    PT Home Exercise Plan 1/26 self STM, overhead stretch 1/31  [id number] 2/9 added to same code    Consulted and Agree with Plan of Care Patient             Patient will benefit from skilled therapeutic intervention in order to improve the following deficits and impairments:  Decreased range of motion, Decreased activity tolerance, Decreased mobility, Decreased strength, Improper body mechanics, Impaired flexibility, Pain  Visit Diagnosis: Pain in thoracic spine  Muscle weakness (generalized)  Other abnormalities of gait and mobility  Other symptoms and signs involving the musculoskeletal system     Problem List There are no problems to display for this patient.   8:26 AM, 11/19/21 Wyman Songster PT, DPT Physical Therapist at [facility]   [facility] [address] [place], [place], [address] Phone: [phone]   Fax:  [phone]  Name: EVONNA STOLTZ MRN: [id number] Date of Birth: [birth year]

## 2021-11-26 ENCOUNTER — Other Ambulatory Visit: Payer: Self-pay

## 2021-11-26 ENCOUNTER — Ambulatory Visit (HOSPITAL_COMMUNITY): Payer: 59 | Admitting: Physical Therapy

## 2021-11-26 ENCOUNTER — Encounter (HOSPITAL_COMMUNITY): Payer: Self-pay | Admitting: Physical Therapy

## 2021-11-26 DIAGNOSIS — R29898 Other symptoms and signs involving the musculoskeletal system: Secondary | ICD-10-CM

## 2021-11-26 DIAGNOSIS — M6281 Muscle weakness (generalized): Secondary | ICD-10-CM

## 2021-11-26 DIAGNOSIS — M546 Pain in thoracic spine: Secondary | ICD-10-CM

## 2021-11-26 DIAGNOSIS — R2689 Other abnormalities of gait and mobility: Secondary | ICD-10-CM

## 2021-11-26 NOTE — Therapy (Signed)
[place], [place], [address] Phone: [phone]   Fax:  [phone]  Physical Therapy Treatment/Discharge Summary  Patient Details  Name: Ann Walters MRN: [id number] Date of Birth: [birth year] Referring Provider (PT): Thayer Ohm PA   Encounter Date: 11/26/2021 PHYSICAL THERAPY DISCHARGE SUMMARY  Visits from Start of Care: 4  Current functional level related to goals / functional outcomes: See below   Remaining deficits: See below   Education / Equipment: See below   Patient agrees to discharge. Patient goals were met. Patient is being discharged due to being pleased with the current functional level.     PT End of Session - 11/26/21 0753     Visit Number 4    Number of Visits 6    Date for PT Re-Evaluation 12/17/21    Authorization Type [employer] (no auth, 60 VL)    PT Start Time 0752    PT Stop Time 0820    PT Time Calculation (min) 28 min    Activity Tolerance Patient tolerated treatment well    Behavior During Therapy [facility] for tasks assessed/performed             History reviewed. No pertinent past medical history.  Past Surgical History:  Procedure Laterality Date   ABDOMINAL HYSTERECTOMY     AUGMENTATION MAMMAPLASTY Bilateral 2011   BREAST ENHANCEMENT SURGERY     BREAST EXCISIONAL BIOPSY Right    COMBINED ABDOMINOPLASTY AND LIPOSUCTION      There were no vitals filed for this visit.   Subjective Assessment - 11/26/21 0753     Subjective States shoulder and back is doing a lot better. Home exercises are going well. Patient states 93% improvement with PT intervention. Remaining deficit is due to not being as diligent with HEP as she would like. Ever since her back popped she has been doing better.    Limitations Lifting    Patient Stated Goals strengthen her back to keep healthy.    Currently in Pain? No/denies                [facility] PT Assessment - 11/26/21 0001        Assessment   Medical Diagnosis mid back pain on L side    Referring Provider (PT) Thayer Ohm PA    Onset Date/Surgical Date 07/11/21    Hand Dominance Right    Next MD Visit March      Precautions   Precautions None      Restrictions   Weight Bearing Restrictions No      Balance Screen   Has the patient fallen in the past 6 months No    Has the patient had a decrease in activity level because of a fear of falling?  No    Is the patient reluctant to leave their home because of a fear of falling?  No      Prior Function   Level of Independence Independent    Vocation Full time employment    Management consultant      Cognition   Overall Cognitive Status Within Functional Limits for tasks assessed      Observation/Other Assessments   Observations Ambulates without AD    Focus on Therapeutic Outcomes (FOTO)  n/a      AROM   Overall AROM Comments shoulder and lumbar WFL      Palpation   Palpation comment minimal tenderness periscap on L  Special Tests   Other special tests lifting mechanics: uses hip hinge for lifting, squat for lowering; 8 inch step filled with approx 35lbs                                    PT Education - 11/26/21 0753     Education Details HEP, exercise mechanics, lifting mechanics, POC, splitting loads of heavy boxes, using dolly    Person(s) Educated Patient    Methods Explanation;Demonstration    Comprehension Verbalized understanding;Returned demonstration              PT Short Term Goals - 11/26/21 0757       PT SHORT TERM GOAL #1   Title Patient will be independent with HEP in order to improve functional outcomes.    Time 3    Period Weeks    Status Achieved    Target Date 11/26/21      PT SHORT TERM GOAL #2   Title Patient will report at least 25% improvement in symptoms for improved quality of life.    Time 3    Period Weeks    Status Achieved    Target Date  11/26/21               PT Long Term Goals - 11/26/21 0758       PT LONG TERM GOAL #1   Title Patient will report at least 75% improvement in symptoms for improved quality of life.    Time 6    Period Weeks    Status Achieved    Target Date 12/17/21      PT LONG TERM GOAL #2   Title Patient will be able to return to all activities unrestricted for improved ability to perform work functions and participate with family.    Time 6    Period Weeks    Status Achieved    Target Date 12/17/21      PT LONG TERM GOAL #3   Title Patient will be able to demonstrate good lifting mechanics in order to reduce stress on spine at work.    Time 6    Period Weeks    Status Achieved    Target Date 12/17/21                   Plan - 11/26/21 0753     Clinical Impression Statement Patient has met all short and long term goals at this time with ability to complete HEP and improvement in symptoms, lifting, and return to ADL. Patient with intermittent symptoms with strenuous activity. Patient educated and performs box lifting with proper mechanics. Discussed methods to improve work functioning/lifting. Patient discharged from physical therapy at this time.    Personal Factors and Comorbidities Age;Profession;Time since onset of injury/illness/exacerbation    Examination-Activity Limitations Lift;Squat;Bend;Reach Overhead    Examination-Participation Restrictions Occupation;Cleaning;Yard Work;Laundry;Community Activity    Stability/Clinical Decision Making Stable/Uncomplicated    Rehab Potential Good    PT Frequency --    PT Duration --    PT Treatment/Interventions ADLs/Self Care Home Management;Aquatic Therapy;Cryotherapy;Electrical Stimulation;Moist Heat;Traction;DME Instruction;Gait training;Stair training;Functional mobility training;Therapeutic activities;Therapeutic exercise;Balance training;Neuromuscular re-education;Patient/family education;Orthotic Fit/Training;Manual  techniques;Scar mobilization;Passive range of motion;Dry needling;Energy conservation;Spinal Manipulations;Joint Manipulations    PT Next Visit Plan Trunk and UE strength, overhead lifting, box lifting; possibly DN if needed    PT Home Exercise Plan 1/26 self STM, overhead stretch 1/31  [id number] 2/9 added  to same code    Consulted and Agree with Plan of Care Patient             Patient will benefit from skilled therapeutic intervention in order to improve the following deficits and impairments:  Decreased range of motion, Decreased activity tolerance, Decreased mobility, Decreased strength, Improper body mechanics, Impaired flexibility, Pain  Visit Diagnosis: Pain in thoracic spine  Muscle weakness (generalized)  Other abnormalities of gait and mobility  Other symptoms and signs involving the musculoskeletal system     Problem List There are no problems to display for this patient.   8:27 AM, 11/26/21 Mearl Latin PT, DPT Physical Therapist at [place], [place], [address] Phone: [phone]   Fax:  [phone]  Name: Ann Walters MRN: [id number] Date of Birth: [birth year]

## 2021-12-03 ENCOUNTER — Encounter (HOSPITAL_COMMUNITY): Payer: 59 | Admitting: Physical Therapy

## 2021-12-09 ENCOUNTER — Encounter (HOSPITAL_COMMUNITY): Payer: 59 | Admitting: Physical Therapy

## 2021-12-16 ENCOUNTER — Encounter (HOSPITAL_COMMUNITY): Payer: 59 | Admitting: Physical Therapy

## 2022-05-26 ENCOUNTER — Other Ambulatory Visit: Payer: Self-pay | Admitting: Physician Assistant

## 2022-05-26 DIAGNOSIS — E7800 Pure hypercholesterolemia, unspecified: Secondary | ICD-10-CM

## 2022-05-26 DIAGNOSIS — E119 Type 2 diabetes mellitus without complications: Secondary | ICD-10-CM

## 2022-05-26 DIAGNOSIS — Z8249 Family history of ischemic heart disease and other diseases of the circulatory system: Secondary | ICD-10-CM

## 2022-05-26 DIAGNOSIS — Z823 Family history of stroke: Secondary | ICD-10-CM

## 2022-05-28 ENCOUNTER — Other Ambulatory Visit: Payer: Self-pay | Admitting: Physician Assistant

## 2022-05-28 DIAGNOSIS — Z1231 Encounter for screening mammogram for malignant neoplasm of breast: Secondary | ICD-10-CM

## 2022-06-02 ENCOUNTER — Ambulatory Visit
Admission: RE | Admit: 2022-06-02 | Discharge: 2022-06-02 | Disposition: A | Discharge status: Home / Self Care | Payer: 59 | Source: Ambulatory Visit | Attending: Physician Assistant | Admitting: Physician Assistant

## 2022-06-02 DIAGNOSIS — Z823 Family history of stroke: Secondary | ICD-10-CM

## 2022-06-02 DIAGNOSIS — Z8249 Family history of ischemic heart disease and other diseases of the circulatory system: Secondary | ICD-10-CM

## 2022-06-02 DIAGNOSIS — E7800 Pure hypercholesterolemia, unspecified: Secondary | ICD-10-CM

## 2022-06-02 DIAGNOSIS — E119 Type 2 diabetes mellitus without complications: Secondary | ICD-10-CM

## 2022-06-16 ENCOUNTER — Ambulatory Visit
Admission: RE | Admit: 2022-06-16 | Discharge: 2022-06-16 | Disposition: A | Discharge status: Home / Self Care | Payer: 59 | Source: Ambulatory Visit | Attending: Physician Assistant | Admitting: Physician Assistant

## 2022-06-16 DIAGNOSIS — Z1231 Encounter for screening mammogram for malignant neoplasm of breast: Secondary | ICD-10-CM

## 2022-08-06 ENCOUNTER — Other Ambulatory Visit: Payer: 59

## 2023-01-06 ENCOUNTER — Encounter: Payer: Self-pay | Admitting: Plastic Surgery

## 2023-01-06 ENCOUNTER — Ambulatory Visit (INDEPENDENT_AMBULATORY_CARE_PROVIDER_SITE_OTHER): Payer: 59 | Admitting: Plastic Surgery

## 2023-01-06 VITALS — BP 133/72 | HR 93 | Ht 63.5 in | Wt 179.4 lb

## 2023-01-06 DIAGNOSIS — Z9882 Breast implant status: Secondary | ICD-10-CM

## 2023-01-06 DIAGNOSIS — Z719 Counseling, unspecified: Secondary | ICD-10-CM | POA: Insufficient documentation

## 2023-01-06 DIAGNOSIS — N6459 Other signs and symptoms in breast: Secondary | ICD-10-CM

## 2023-01-06 NOTE — Progress Notes (Signed)
Patient ID: Ann Walters, female    DOB: Aug 04, 1961, 62 y.o.   MRN: [id number]   Chief Complaint  Patient presents with   Consult   Breast Problem    The patient is a 62 year old female here for evaluation of her breasts.  The patient underwent breast implants over 20 years ago at [facility].  There was a rupture on the right side since she had them replaced slightly less than 20 years ago.  She does not have a card and she cannot remember who did it but it was in the residency clinic.  Over the past week or so she noticed the left breast changing in size and so she realized that the left breast implant had ruptured.  She believes that the first set and this set are saline.  She believes that they were under the breast tissue and above the muscle.  She does not know how much volume was in the implant but she knows that the left side had less volume than the right.  The reason for this is she had a partial mastectomy/lumpectomy in the past on the right side.  She was doing yard work when this happened.  She was probably around a B cup prior to the implant placement.  She is now a DD cup but admits to gaining weight.  Her last mammogram was September 2023.  She is diabetic and her last hemoglobin A1c was in February it was 6.1.  She has grade 2 ptosis.  Her sternal notch to nipple distance on the right is 24 cm and on the left is 26 cm.  It looks like she actually has reasonable breast volume and could have just a mastopexy without implant placement.  She has very little color if any to the areola so she is interested in tattooing.    Review of Systems  Constitutional: Negative.   HENT: Negative.    Eyes: Negative.   Respiratory: Negative.  Negative for chest tightness and shortness of breath.   Cardiovascular: Negative.   Gastrointestinal: Negative.   Genitourinary: Negative.   Musculoskeletal: Negative.     History reviewed. No pertinent past medical history.  Past Surgical History:   Procedure Laterality Date   ABDOMINAL HYSTERECTOMY     AUGMENTATION MAMMAPLASTY Bilateral 2011   BREAST ENHANCEMENT SURGERY     BREAST EXCISIONAL BIOPSY Right    COMBINED ABDOMINOPLASTY AND LIPOSUCTION        Current Outpatient Medications:    cholecalciferol (VITAMIN D) 1000 UNITS tablet, Take 2,000 Units by mouth every morning. , Disp: , Rfl:    Multiple Vitamins-Minerals (MULTIVITAMIN WITH MINERALS) tablet, Take 1 tablet by mouth every morning. , Disp: , Rfl:    venlafaxine XR (EFFEXOR-XR) 75 MG 24 hr capsule, Take 75 mg by mouth every morning., Disp: , Rfl:    Objective:   Vitals:   01/06/23 1523  BP: 133/72  Pulse: 93  SpO2: 96%    Physical Exam Vitals and nursing note reviewed.  Constitutional:      Appearance: Normal appearance.  HENT:     Head: Normocephalic and atraumatic.  Cardiovascular:     Rate and Rhythm: Normal rate.     Pulses: Normal pulses.  Pulmonary:     Effort: Pulmonary effort is normal.  Musculoskeletal:        General: No swelling or deformity.  Skin:    General: Skin is warm.     Capillary Refill: Capillary refill takes less than  2 seconds.     Coloration: Skin is not jaundiced.     Findings: No bruising.  Neurological:     Mental Status: She is alert and oriented to person, place, and time.  Psychiatric:        Mood and Affect: Mood normal.        Behavior: Behavior normal.        Thought Content: Thought content normal.     Assessment & Plan:  Encounter for counseling  We will provide the patient with a quote for removal and replacement of saline implants/mastopexy and a quote with just removal and mastopexy.  Will also include a quote for nipple areola tattooing.  The patient will think things over and let us know if she would like to do.  Pictures were obtained of the patient and placed in the chart with the patient's or guardian's permission.   [place], DO

## 2023-01-09 ENCOUNTER — Encounter: Payer: Self-pay | Admitting: Plastic Surgery

## 2023-01-14 ENCOUNTER — Encounter: Payer: Self-pay | Admitting: *Deleted

## 2023-01-25 ENCOUNTER — Ambulatory Visit (INDEPENDENT_AMBULATORY_CARE_PROVIDER_SITE_OTHER): Payer: 59 | Admitting: Plastic Surgery

## 2023-01-25 ENCOUNTER — Encounter: Payer: Self-pay | Admitting: Plastic Surgery

## 2023-01-25 DIAGNOSIS — Z9882 Breast implant status: Secondary | ICD-10-CM | POA: Diagnosis not present

## 2023-01-25 DIAGNOSIS — N6459 Other signs and symptoms in breast: Secondary | ICD-10-CM

## 2023-01-25 DIAGNOSIS — Z719 Counseling, unspecified: Secondary | ICD-10-CM

## 2023-01-25 NOTE — Progress Notes (Addendum)
   Subjective:    Patient ID: Ann Walters, female    DOB: Apr 02, 1961, 62 y.o.   MRN: [id number]  The patient is a 62 year old female here for evaluation of her breasts.  She has a deflated left breast implant.  She had implants placed over 20 years ago at [facility].  She had a rupture on the right side and that was replaced.  She does not have a card and does not know what size she has in.  It was done in the residency clinic.  In March she noticed that the left side was changing in size and deflating.  She believes that they are saline implants and that they are above the muscle.  She had a partial lumpectomy on on the right side so there may be a volume difference between the 2 sides.  She is thought about what she wants to do and has decided that she wants to have the implants removed and will opt for a mastopexy sometime later.      Review of Systems  Constitutional: Negative.   Eyes: Negative.   Respiratory: Negative.    Gastrointestinal: Negative.   Endocrine: Negative.   Genitourinary: Negative.        Objective:   Physical Exam Constitutional:      Appearance: Normal appearance.  Cardiovascular:     Rate and Rhythm: Normal rate.     Pulses: Normal pulses.  Skin:    Capillary Refill: Capillary refill takes less than 2 seconds.  Neurological:     Mental Status: She is alert and oriented to person, place, and time.  Psychiatric:        Mood and Affect: Mood normal.        Behavior: Behavior normal.        Thought Content: Thought content normal.        Judgment: Judgment normal.           Assessment & Plan:     ICD-10-CM   1. Encounter for counseling  Z71.9        The patient would like to move ahead with removal of bilateral breast implants and would like to see if it could be done in early May because she is got a vacation planned from May 11 to May 18.

## 2023-01-27 ENCOUNTER — Telehealth: Payer: Self-pay | Admitting: Plastic Surgery

## 2023-01-27 NOTE — Telephone Encounter (Signed)
Pending Ref# [id number], call pt and notified of case started.

## 2023-02-14 NOTE — Progress Notes (Unsigned)
Patient ID: Ann Walters, female    DOB: [birth year], 62 y.o.   MRN: [id number]  No chief complaint on file.   No diagnosis found.   History of Present Illness: Ann Walters is a 63 y.o.  female  with a history of breast cancer s/p partial mastectomy/lumpectomy with bilateral breast implant-based reconstruction.  She presents for preoperative evaluation for upcoming procedure, bilateral breast implant removal, scheduled for 03/02/2023 with Dr. Ulice Bold.  The patient {HAS HAS [id number]} had problems with anesthesia. ***  Summary of Previous Visit: She was seen for consult 01/06/2023.  At that time, discussed breast implant placement greater than 20 years ago.  She believes that they are saline, but unclear.  She states that she was a B cup prior to the implant placement and is now a DD cup.  STN 24 cm on the right, 26 cm on the left.  Discussed possible mastopexy without implant replacement after her implant removal.  She also expressed that she was interested in areolar tattooing given the lack of pigment.  She then returned 01/25/2023 and stated that she would like to proceed with just the implant removal and would consider mastopexy afterwards.  Job: ***  PMH Significant for: Breast cancer s/p partial mastectomy and bilateral breast implant-based reconstruction 20 years ago, anxiety.   Past Medical History: Allergies: Allergies  Allergen Reactions   Ampicillin    Sulfa Antibiotics Hives    Current Medications:  Current Outpatient Medications:    cholecalciferol (VITAMIN D) 1000 UNITS tablet, Take 2,000 Units by mouth every morning. , Disp: , Rfl:    Multiple Vitamins-Minerals (MULTIVITAMIN WITH MINERALS) tablet, Take 1 tablet by mouth every morning. , Disp: , Rfl:    venlafaxine XR (EFFEXOR-XR) 75 MG 24 hr capsule, Take 75 mg by mouth every morning., Disp: , Rfl:   Past Medical Problems: No past medical history on file.  Past Surgical History: Past Surgical History:   Procedure Laterality Date   ABDOMINAL HYSTERECTOMY     AUGMENTATION MAMMAPLASTY Bilateral 2011   BREAST ENHANCEMENT SURGERY     BREAST EXCISIONAL BIOPSY Right    COMBINED ABDOMINOPLASTY AND LIPOSUCTION      Social History: Social History   Socioeconomic History   Marital status: Married    Spouse name: Not on file   Number of children: Not on file   Years of education: Not on file   Highest education level: Not on file  Occupational History   Not on file  Tobacco Use   Smoking status: Never   Smokeless tobacco: Not on file  Substance and Sexual Activity   Alcohol use: Yes    Comment: occasional   Drug use: No   Sexual activity: Never  Other Topics Concern   Not on file  Social History Narrative   Not on file   Social Determinants of Health   Financial Resource Strain: Not on file  Food Insecurity: Not on file  Transportation Needs: Not on file  Physical Activity: Not on file  Stress: Not on file  Social Connections: Not on file  Intimate Partner Violence: Not on file    Family History: Family History  Problem Relation Age of Onset   Breast cancer Neg Hx     Review of Systems: ROS  Physical Exam: Vital Signs There were no vitals taken for this visit.  Physical Exam *** Constitutional:      General: Not in acute distress.    Appearance: Normal appearance. Not ill-appearing.  HENT:     Head: Normocephalic and atraumatic.  Eyes:     Pupils: Pupils are equal, round. Cardiovascular:     Rate and Rhythm: Normal rate.    Pulses: Normal pulses.  Pulmonary:     Effort: No respiratory distress or increased work of breathing.  Speaks in full sentences. Abdominal:     General: Abdomen is flat. No distension.   Musculoskeletal: Normal range of motion. No lower extremity swelling or edema. No varicosities. *** Skin:    General: Skin is warm and dry.     Findings: No erythema or rash.  Neurological:     Mental Status: Alert and oriented to person, place,  and time.  Psychiatric:        Mood and Affect: Mood normal.        Behavior: Behavior normal.    Assessment/Plan: The patient is scheduled for removal of bilateral breast implants with Dr. Ulice Bold.  Risks, benefits, and alternatives of procedure discussed, questions answered and consent obtained.    Smoking Status: ***; Counseling Given? *** Last Mammogram: 06/2022; Results: BI-RADS Category 1: Negative.  Retroglandular implants noted.  Caprini Score: ***; Risk Factors include: ***, BMI *** 25, and length of planned surgery. Recommendation for mechanical *** pharmacological prophylaxis. Encourage early ambulation.   Pictures obtained: 01/06/2023.  Post-op Rx sent to pharmacy: ***  Patient was provided with the General Surgical Risk consent document and Pain Medication Agreement prior to their appointment.  They had adequate time to read through the risk consent documents and Pain Medication Agreement. We also discussed them in person together during this preop appointment. All of their questions were answered to their satisfaction.  Recommended calling if they have any further questions.  Risk consent form and Pain Medication Agreement to be scanned into patient's chart.    Electronically signed by: Evelena Leyden, PA-C 02/14/2023 3:48 PM

## 2023-02-14 NOTE — H&P (View-Only) (Signed)
   Patient ID: Ann Walters, female    DOB: 01/06/1961, 61 y.o.   MRN: [id number]  No chief complaint on file.   No diagnosis found.   History of Present Illness: Ann Walters is a 61 y.o.  female  with a history of breast cancer s/p partial mastectomy/lumpectomy with bilateral breast implant-based reconstruction.  She presents for preoperative evaluation for upcoming procedure, bilateral breast implant removal, scheduled for 03/02/2023 with Dr. Dillingham.  The patient {HAS HAS NOT:[id number]} had problems with anesthesia. ***  Summary of Previous Visit: She was seen for consult 01/06/2023.  At that time, discussed breast implant placement greater than 20 years ago.  She believes that they are saline, but unclear.  She states that she was a B cup prior to the implant placement and is now a DD cup.  STN 24 cm on the right, 26 cm on the left.  Discussed possible mastopexy without implant replacement after her implant removal.  She also expressed that she was interested in areolar tattooing given the lack of pigment.  She then returned 01/25/2023 and stated that she would like to proceed with just the implant removal and would consider mastopexy afterwards.  Job: ***  PMH Significant for: Breast cancer s/p partial mastectomy and bilateral breast implant-based reconstruction 20 years ago, anxiety.   Past Medical History: Allergies: Allergies  Allergen Reactions   Ampicillin    Sulfa Antibiotics Hives    Current Medications:  Current Outpatient Medications:    cholecalciferol (VITAMIN D) 1000 UNITS tablet, Take 2,000 Units by mouth every morning. , Disp: , Rfl:    Multiple Vitamins-Minerals (MULTIVITAMIN WITH MINERALS) tablet, Take 1 tablet by mouth every morning. , Disp: , Rfl:    venlafaxine XR (EFFEXOR-XR) 75 MG 24 hr capsule, Take 75 mg by mouth every morning., Disp: , Rfl:   Past Medical Problems: No past medical history on file.  Past Surgical History: Past Surgical History:   Procedure Laterality Date   ABDOMINAL HYSTERECTOMY     AUGMENTATION MAMMAPLASTY Bilateral 2011   BREAST ENHANCEMENT SURGERY     BREAST EXCISIONAL BIOPSY Right    COMBINED ABDOMINOPLASTY AND LIPOSUCTION      Social History: Social History   Socioeconomic History   Marital status: Married    Spouse name: Not on file   Number of children: Not on file   Years of education: Not on file   Highest education level: Not on file  Occupational History   Not on file  Tobacco Use   Smoking status: Never   Smokeless tobacco: Not on file  Substance and Sexual Activity   Alcohol use: Yes    Comment: occasional   Drug use: No   Sexual activity: Never  Other Topics Concern   Not on file  Social History Narrative   Not on file   Social Determinants of Health   Financial Resource Strain: Not on file  Food Insecurity: Not on file  Transportation Needs: Not on file  Physical Activity: Not on file  Stress: Not on file  Social Connections: Not on file  Intimate Partner Violence: Not on file    Family History: Family History  Problem Relation Age of Onset   Breast cancer Neg Hx     Review of Systems: ROS  Physical Exam: Vital Signs There were no vitals taken for this visit.  Physical Exam *** Constitutional:      General: Not in acute distress.    Appearance: Normal appearance. Not ill-appearing.    HENT:     Head: Normocephalic and atraumatic.  Eyes:     Pupils: Pupils are equal, round. Cardiovascular:     Rate and Rhythm: Normal rate.    Pulses: Normal pulses.  Pulmonary:     Effort: No respiratory distress or increased work of breathing.  Speaks in full sentences. Abdominal:     General: Abdomen is flat. No distension.   Musculoskeletal: Normal range of motion. No lower extremity swelling or edema. No varicosities. *** Skin:    General: Skin is warm and dry.     Findings: No erythema or rash.  Neurological:     Mental Status: Alert and oriented to person, place,  and time.  Psychiatric:        Mood and Affect: Mood normal.        Behavior: Behavior normal.    Assessment/Plan: The patient is scheduled for removal of bilateral breast implants with Dr. Dillingham.  Risks, benefits, and alternatives of procedure discussed, questions answered and consent obtained.    Smoking Status: ***; Counseling Given? *** Last Mammogram: 06/2022; Results: BI-RADS Category 1: Negative.  Retroglandular implants noted.  Caprini Score: ***; Risk Factors include: ***, BMI *** 25, and length of planned surgery. Recommendation for mechanical *** pharmacological prophylaxis. Encourage early ambulation.   Pictures obtained: 01/06/2023.  Post-op Rx sent to pharmacy: ***  Patient was provided with the General Surgical Risk consent document and Pain Medication Agreement prior to their appointment.  They had adequate time to read through the risk consent documents and Pain Medication Agreement. We also discussed them in person together during this preop appointment. All of their questions were answered to their satisfaction.  Recommended calling if they have any further questions.  Risk consent form and Pain Medication Agreement to be scanned into patient's chart.    Electronically signed by: Sarabella Caprio, PA-C 02/14/2023 3:48 PM 

## 2023-02-15 ENCOUNTER — Ambulatory Visit (INDEPENDENT_AMBULATORY_CARE_PROVIDER_SITE_OTHER): Payer: 59 | Admitting: Physician Assistant

## 2023-02-15 VITALS — BP 160/83 | HR 97 | Wt 177.4 lb

## 2023-02-15 DIAGNOSIS — Z719 Counseling, unspecified: Secondary | ICD-10-CM

## 2023-02-15 MED ORDER — OXYCODONE HCL 5 MG PO TABS: 5.0000 mg | ORAL_TABLET | Freq: Four times a day (QID) | ORAL | 0 refills | Status: AC | PRN

## 2023-02-15 MED ORDER — CLINDAMYCIN HCL 150 MG PO CAPS: 450.0000 mg | ORAL_CAPSULE | Freq: Three times a day (TID) | ORAL | 0 refills | Status: AC

## 2023-02-15 MED ORDER — ONDANSETRON 4 MG PO TBDP: 4.0000 mg | ORAL_TABLET | Freq: Three times a day (TID) | ORAL | 0 refills | Status: DC | PRN

## 2023-02-17 DIAGNOSIS — Z719 Counseling, unspecified: Secondary | ICD-10-CM

## 2023-02-22 ENCOUNTER — Encounter (HOSPITAL_BASED_OUTPATIENT_CLINIC_OR_DEPARTMENT_OTHER): Payer: Self-pay | Admitting: Plastic Surgery

## 2023-02-22 ENCOUNTER — Other Ambulatory Visit: Payer: Self-pay

## 2023-02-28 ENCOUNTER — Encounter (HOSPITAL_BASED_OUTPATIENT_CLINIC_OR_DEPARTMENT_OTHER)
Admission: RE | Admit: 2023-02-28 | Discharge: 2023-02-28 | Disposition: A | Discharge status: Home / Self Care | Payer: 59 | Source: Ambulatory Visit | Attending: Plastic Surgery | Admitting: Plastic Surgery

## 2023-02-28 DIAGNOSIS — I10 Essential (primary) hypertension: Secondary | ICD-10-CM | POA: Insufficient documentation

## 2023-02-28 DIAGNOSIS — Z01810 Encounter for preprocedural cardiovascular examination: Secondary | ICD-10-CM | POA: Insufficient documentation

## 2023-03-02 ENCOUNTER — Ambulatory Visit (HOSPITAL_BASED_OUTPATIENT_CLINIC_OR_DEPARTMENT_OTHER): Payer: 59 | Admitting: Anesthesiology

## 2023-03-02 ENCOUNTER — Encounter (HOSPITAL_BASED_OUTPATIENT_CLINIC_OR_DEPARTMENT_OTHER): Payer: Self-pay | Admitting: Plastic Surgery

## 2023-03-02 ENCOUNTER — Other Ambulatory Visit: Payer: Self-pay

## 2023-03-02 ENCOUNTER — Encounter (HOSPITAL_BASED_OUTPATIENT_CLINIC_OR_DEPARTMENT_OTHER)
Admission: RE | Disposition: A | Discharge status: Home / Self Care | Payer: Self-pay | Source: Home / Self Care | Attending: Plastic Surgery

## 2023-03-02 ENCOUNTER — Ambulatory Visit (HOSPITAL_BASED_OUTPATIENT_CLINIC_OR_DEPARTMENT_OTHER)
Admission: RE | Admit: 2023-03-02 | Discharge: 2023-03-02 | Disposition: A | Discharge status: Home / Self Care | Payer: 59 | Attending: Plastic Surgery | Admitting: Plastic Surgery

## 2023-03-02 DIAGNOSIS — F419 Anxiety disorder, unspecified: Secondary | ICD-10-CM | POA: Insufficient documentation

## 2023-03-02 DIAGNOSIS — E119 Type 2 diabetes mellitus without complications: Secondary | ICD-10-CM | POA: Insufficient documentation

## 2023-03-02 DIAGNOSIS — G4733 Obstructive sleep apnea (adult) (pediatric): Secondary | ICD-10-CM | POA: Diagnosis not present

## 2023-03-02 DIAGNOSIS — Z01818 Encounter for other preprocedural examination: Secondary | ICD-10-CM

## 2023-03-02 DIAGNOSIS — I10 Essential (primary) hypertension: Secondary | ICD-10-CM

## 2023-03-02 DIAGNOSIS — G4730 Sleep apnea, unspecified: Secondary | ICD-10-CM | POA: Diagnosis not present

## 2023-03-02 DIAGNOSIS — T8543XA Leakage of breast prosthesis and implant, initial encounter: Secondary | ICD-10-CM | POA: Diagnosis not present

## 2023-03-02 DIAGNOSIS — Y812 Prosthetic and other implants, materials and accessory general- and plastic-surgery devices associated with adverse incidents: Secondary | ICD-10-CM | POA: Insufficient documentation

## 2023-03-02 DIAGNOSIS — Z9989 Dependence on other enabling machines and devices: Secondary | ICD-10-CM | POA: Diagnosis not present

## 2023-03-02 DIAGNOSIS — Z79899 Other long term (current) drug therapy: Secondary | ICD-10-CM | POA: Diagnosis not present

## 2023-03-02 DIAGNOSIS — T8549XA Other mechanical complication of breast prosthesis and implant, initial encounter: Secondary | ICD-10-CM | POA: Diagnosis present

## 2023-03-02 HISTORY — DX: Nausea with vomiting, unspecified: Z98.890

## 2023-03-02 HISTORY — DX: Essential (primary) hypertension: I10

## 2023-03-02 HISTORY — PX: BREAST IMPLANT REMOVAL: SHX5361

## 2023-03-02 HISTORY — DX: Sleep apnea, unspecified: G47.30

## 2023-03-02 HISTORY — DX: Prediabetes: R73.03

## 2023-03-02 LAB — GLUCOSE, CAPILLARY: Glucose-Capillary: 130 mg/dL — ABNORMAL HIGH (ref 70–99)

## 2023-03-02 SURGERY — REMOVAL, IMPLANT, BREAST
Anesthesia: General | Site: Breast | Laterality: Bilateral

## 2023-03-02 MED ORDER — BUPIVACAINE-EPINEPHRINE (PF) 0.25% -1:200000 IJ SOLN
INTRAMUSCULAR | Status: AC
  Filled 2023-03-02: qty 30

## 2023-03-02 MED ORDER — BUPIVACAINE LIPOSOME 1.3 % IJ SUSP
INTRAMUSCULAR | Status: AC
  Filled 2023-03-02: qty 20

## 2023-03-02 MED ORDER — DEXAMETHASONE SODIUM PHOSPHATE 10 MG/ML IJ SOLN
INTRAMUSCULAR | Status: DC | PRN
  Administered 2023-03-02: 10 mg via INTRAVENOUS

## 2023-03-02 MED ORDER — CIPROFLOXACIN IN D5W 400 MG/200ML IV SOLN
INTRAVENOUS | Status: AC
  Filled 2023-03-02: qty 200

## 2023-03-02 MED ORDER — BUPIVACAINE LIPOSOME 1.3 % IJ SUSP
INTRAMUSCULAR | Status: DC | PRN
  Administered 2023-03-02: 20 mL

## 2023-03-02 MED ORDER — MIDAZOLAM HCL 2 MG/2ML IJ SOLN
INTRAMUSCULAR | Status: AC
  Filled 2023-03-02: qty 2

## 2023-03-02 MED ORDER — MIDAZOLAM HCL 5 MG/5ML IJ SOLN
INTRAMUSCULAR | Status: DC | PRN
  Administered 2023-03-02: 2 mg via INTRAVENOUS

## 2023-03-02 MED ORDER — FENTANYL CITRATE (PF) 100 MCG/2ML IJ SOLN
INTRAMUSCULAR | Status: AC
  Filled 2023-03-02: qty 2

## 2023-03-02 MED ORDER — ONDANSETRON HCL 4 MG/2ML IJ SOLN
INTRAMUSCULAR | Status: DC | PRN
  Administered 2023-03-02: 4 mg via INTRAVENOUS

## 2023-03-02 MED ORDER — SODIUM CHLORIDE 0.9 % IV SOLN
INTRAVENOUS | Status: DC | PRN
  Administered 2023-03-02: 500 mL

## 2023-03-02 MED ORDER — SODIUM CHLORIDE 0.9 % IV SOLN
INTRAVENOUS | Status: AC
  Filled 2023-03-02: qty 10

## 2023-03-02 MED ORDER — OXYCODONE HCL 5 MG PO TABS
5.0000 mg | ORAL_TABLET | Freq: Once | ORAL | Status: AC | PRN
  Administered 2023-03-02: 5 mg via ORAL

## 2023-03-02 MED ORDER — PROMETHAZINE HCL 25 MG/ML IJ SOLN: 6.2500 mg | INTRAMUSCULAR | Status: DC | PRN

## 2023-03-02 MED ORDER — PROPOFOL 10 MG/ML IV BOLUS
INTRAVENOUS | Status: DC | PRN
  Administered 2023-03-02: 200 mg via INTRAVENOUS

## 2023-03-02 MED ORDER — BUPIVACAINE-EPINEPHRINE 0.25% -1:200000 IJ SOLN
INTRAMUSCULAR | Status: DC | PRN
  Administered 2023-03-02: 18 mL

## 2023-03-02 MED ORDER — SCOPOLAMINE 1 MG/3DAYS TD PT72
1.0000 | MEDICATED_PATCH | TRANSDERMAL | Status: DC
  Administered 2023-03-02: 1.5 mg via TRANSDERMAL

## 2023-03-02 MED ORDER — SCOPOLAMINE 1 MG/3DAYS TD PT72
MEDICATED_PATCH | TRANSDERMAL | Status: AC
  Filled 2023-03-02: qty 1

## 2023-03-02 MED ORDER — LACTATED RINGERS IV SOLN: INTRAVENOUS | Status: DC | PRN

## 2023-03-02 MED ORDER — OXYCODONE HCL 5 MG PO TABS
ORAL_TABLET | ORAL | Status: AC
  Filled 2023-03-02: qty 1

## 2023-03-02 MED ORDER — ACETAMINOPHEN 500 MG PO TABS
ORAL_TABLET | ORAL | Status: AC
  Filled 2023-03-02: qty 2

## 2023-03-02 MED ORDER — CIPROFLOXACIN IN D5W 400 MG/200ML IV SOLN
INTRAVENOUS | Status: DC | PRN
  Administered 2023-03-02: 400 mg via INTRAVENOUS

## 2023-03-02 MED ORDER — ACETAMINOPHEN 500 MG PO TABS
1000.0000 mg | ORAL_TABLET | Freq: Once | ORAL | Status: AC
  Administered 2023-03-02: 1000 mg via ORAL

## 2023-03-02 MED ORDER — ONDANSETRON HCL 4 MG/2ML IJ SOLN
INTRAMUSCULAR | Status: AC
  Filled 2023-03-02: qty 2

## 2023-03-02 MED ORDER — FENTANYL CITRATE (PF) 100 MCG/2ML IJ SOLN: 25.0000 ug | INTRAMUSCULAR | Status: DC | PRN

## 2023-03-02 MED ORDER — AMISULPRIDE (ANTIEMETIC) 5 MG/2ML IV SOLN: 10.0000 mg | Freq: Once | INTRAVENOUS | Status: DC | PRN

## 2023-03-02 MED ORDER — LIDOCAINE HCL (CARDIAC) PF 100 MG/5ML IV SOSY
PREFILLED_SYRINGE | INTRAVENOUS | Status: DC | PRN
  Administered 2023-03-02: 60 mg via INTRATRACHEAL

## 2023-03-02 MED ORDER — DEXAMETHASONE SODIUM PHOSPHATE 10 MG/ML IJ SOLN
INTRAMUSCULAR | Status: AC
  Filled 2023-03-02: qty 1

## 2023-03-02 MED ORDER — OXYCODONE HCL 5 MG/5ML PO SOLN: 5.0000 mg | Freq: Once | ORAL | Status: AC | PRN

## 2023-03-02 MED ORDER — LIDOCAINE 2% (20 MG/ML) 5 ML SYRINGE
INTRAMUSCULAR | Status: AC
  Filled 2023-03-02: qty 5

## 2023-03-02 MED ORDER — FENTANYL CITRATE (PF) 100 MCG/2ML IJ SOLN
INTRAMUSCULAR | Status: DC | PRN
  Administered 2023-03-02: 25 ug via INTRAVENOUS
  Administered 2023-03-02: 50 ug via INTRAVENOUS
  Administered 2023-03-02: 25 ug via INTRAVENOUS
  Administered 2023-03-02: 50 ug via INTRAVENOUS

## 2023-03-02 MED ORDER — ACETAMINOPHEN 500 MG PO TABS
ORAL_TABLET | ORAL | Status: AC
  Filled 2023-03-02: qty 1

## 2023-03-02 MED ORDER — PROPOFOL 10 MG/ML IV BOLUS
INTRAVENOUS | Status: AC
  Filled 2023-03-02: qty 20

## 2023-03-02 MED ORDER — KETOROLAC TROMETHAMINE 30 MG/ML IJ SOLN: 30.0000 mg | Freq: Once | INTRAMUSCULAR | Status: DC | PRN

## 2023-03-02 SURGICAL SUPPLY — 70 items
ADH SKN CLS APL DERMABOND .7 (GAUZE/BANDAGES/DRESSINGS)
BAG DECANTER FOR FLEXI CONT (MISCELLANEOUS) ×2 IMPLANT
BINDER BREAST LRG (GAUZE/BANDAGES/DRESSINGS) IMPLANT
BINDER BREAST MEDIUM (GAUZE/BANDAGES/DRESSINGS) IMPLANT
BINDER BREAST XLRG (GAUZE/BANDAGES/DRESSINGS) IMPLANT
BINDER BREAST XXLRG (GAUZE/BANDAGES/DRESSINGS) IMPLANT
BIOPATCH RED 1 DISK 7.0 (GAUZE/BANDAGES/DRESSINGS) IMPLANT
BLADE HEX COATED 2.75 (ELECTRODE) ×2 IMPLANT
BLADE SURG 15 STRL LF DISP TIS (BLADE) ×2 IMPLANT
BLADE SURG 15 STRL SS (BLADE) ×1
BNDG GAUZE DERMACEA FLUFF 4 (GAUZE/BANDAGES/DRESSINGS) ×4 IMPLANT
BNDG GZE DERMACEA 4 6PLY (GAUZE/BANDAGES/DRESSINGS) ×2
CANISTER SUCT 1200ML W/VALVE (MISCELLANEOUS) ×2 IMPLANT
COVER BACK TABLE 60X90IN (DRAPES) ×2 IMPLANT
COVER MAYO STAND STRL (DRAPES) ×2 IMPLANT
DERMABOND ADVANCED .7 DNX12 (GAUZE/BANDAGES/DRESSINGS) IMPLANT
DRAIN CHANNEL 15F RND FF W/TCR (WOUND CARE) IMPLANT
DRAIN CHANNEL 19F RND (DRAIN) IMPLANT
DRAPE LAPAROSCOPIC ABDOMINAL (DRAPES) ×2 IMPLANT
DRESSING MEPILEX FLEX 4X4 (GAUZE/BANDAGES/DRESSINGS) IMPLANT
DRSG MEPILEX FLEX 4X4 (GAUZE/BANDAGES/DRESSINGS) ×2
ELECT BLADE 4.0 EZ CLEAN MEGAD (MISCELLANEOUS) ×1
ELECT BLADE 6.5 EXT (BLADE) IMPLANT
ELECT REM PT RETURN 9FT ADLT (ELECTROSURGICAL) ×1
ELECTRODE BLDE 4.0 EZ CLN MEGD (MISCELLANEOUS) IMPLANT
ELECTRODE REM PT RTRN 9FT ADLT (ELECTROSURGICAL) ×2 IMPLANT
EVACUATOR SILICONE 100CC (DRAIN) IMPLANT
GAUZE PAD ABD 8X10 STRL (GAUZE/BANDAGES/DRESSINGS) ×2 IMPLANT
GLOVE BIO SURGEON STRL SZ 6.5 (GLOVE) ×2 IMPLANT
GOWN STRL REUS W/ TWL LRG LVL3 (GOWN DISPOSABLE) ×4 IMPLANT
GOWN STRL REUS W/TWL LRG LVL3 (GOWN DISPOSABLE) ×3
IV NS 1000ML (IV SOLUTION)
IV NS 1000ML BAXH (IV SOLUTION) IMPLANT
IV NS 500ML (IV SOLUTION)
IV NS 500ML BAXH (IV SOLUTION) IMPLANT
KIT FILL ASEPTIC TRANSFER (MISCELLANEOUS) IMPLANT
NDL HYPO 25X1 1.5 SAFETY (NEEDLE) IMPLANT
NDL SAFETY ECLIP 18X1.5 (MISCELLANEOUS) IMPLANT
NEEDLE HYPO 25X1 1.5 SAFETY (NEEDLE) ×1 IMPLANT
NS IRRIG 1000ML POUR BTL (IV SOLUTION) IMPLANT
PACK BASIN DAY SURGERY FS (CUSTOM PROCEDURE TRAY) ×2 IMPLANT
PENCIL SMOKE EVACUATOR (MISCELLANEOUS) ×2 IMPLANT
PIN SAFETY STERILE (MISCELLANEOUS) IMPLANT
SLEEVE SCD COMPRESS KNEE MED (STOCKING) ×2 IMPLANT
SPIKE FLUID TRANSFER (MISCELLANEOUS) IMPLANT
SPONGE T-LAP 18X18 ~~LOC~~+RFID (SPONGE) ×4 IMPLANT
STRIP SUTURE WOUND CLOSURE 1/2 (MISCELLANEOUS) IMPLANT
SUCT RESERVOIR 100CC (MISCELLANEOUS) IMPLANT
SUT MNCRL AB 4-0 PS2 18 (SUTURE) IMPLANT
SUT MON AB 3-0 SH 27 (SUTURE) ×2
SUT MON AB 3-0 SH27 (SUTURE) ×2 IMPLANT
SUT MON AB 5-0 PS2 18 (SUTURE) ×2 IMPLANT
SUT PDS 3-0 CT2 (SUTURE) ×2
SUT PDS AB 2-0 CT2 27 (SUTURE) IMPLANT
SUT PDS II 3-0 CT2 27 ABS (SUTURE) IMPLANT
SUT PROLENE 3 0 PS 2 (SUTURE) IMPLANT
SUT SILK 3 0 PS 1 (SUTURE) IMPLANT
SUT VIC AB 3-0 SH 27 (SUTURE) ×3
SUT VIC AB 3-0 SH 27X BRD (SUTURE) IMPLANT
SUT VIC AB 4-0 PS2 18 (SUTURE) IMPLANT
SWAB COLLECTION DEVICE MRSA (MISCELLANEOUS) IMPLANT
SWAB CULTURE ESWAB REG 1ML (MISCELLANEOUS) IMPLANT
SYR 50ML LL SCALE MARK (SYRINGE) IMPLANT
SYR BULB IRRIG 60ML STRL (SYRINGE) ×2 IMPLANT
SYR CONTROL 10ML LL (SYRINGE) IMPLANT
TOWEL GREEN STERILE FF (TOWEL DISPOSABLE) ×4 IMPLANT
TRAY DSU PREP LF (CUSTOM PROCEDURE TRAY) ×2 IMPLANT
TUBE CONNECTING 20X1/4 (TUBING) ×2 IMPLANT
UNDERPAD 30X36 HEAVY ABSORB (UNDERPADS AND DIAPERS) ×4 IMPLANT
YANKAUER SUCT BULB TIP NO VENT (SUCTIONS) ×2 IMPLANT

## 2023-03-02 NOTE — Anesthesia Procedure Notes (Signed)
Procedure Name: LMA Insertion Date/Time: 03/02/2023 7:27 AM  Performed by: Thornell Mule, CRNAPre-anesthesia Checklist: Patient identified, Emergency Drugs available, Suction available and Patient being monitored Patient Re-evaluated:Patient Re-evaluated prior to induction Oxygen Delivery Method: Circle system utilized Preoxygenation: Pre-oxygenation with 100% oxygen Induction Type: IV induction LMA: LMA inserted LMA Size: 4.0 Number of attempts: 1 Placement Confirmation: positive ETCO2 Tube secured with: Tape Dental Injury: Teeth and Oropharynx as per pre-operative assessment

## 2023-03-02 NOTE — Interval H&P Note (Signed)
History and Physical Interval Note:  03/02/2023 7:11 AM  Ann Walters  has presented today for surgery, with the diagnosis of Ruptured breast implant.  The various methods of treatment have been discussed with the patient and family. After consideration of risks, benefits and other options for treatment, the patient has consented to  Procedure(s): removal of bilateral breast implants (Bilateral) as a surgical intervention.  The patient's history has been reviewed, patient examined, no change in status, stable for surgery.  I have reviewed the patient's chart and labs.  Questions were answered to the patient's satisfaction.     Ann Walters

## 2023-03-02 NOTE — Progress Notes (Signed)
Patient is a pleasant 62 year old female with PMH of mastectomy/lumpectomy with bilateral breast implant based reconstruction now s/p bilateral breast implant removal performed 03/02/2023 by Dr. Ulice Bold who joins via telephone for postoperative day 2 check-in.  The patient was at home and this provider was calling from their office.  A total of 10 minutes was spent speaking with the patient and reviewing chart.    Patient is doing well.  She states that she is alternating ibuprofen and Tylenol for discomfort and has only had to take a couple of oxycodone for breakthrough pain.  She states that she is tolerating p.o. intake without difficulty, voiding including BM.  Ambulatory at home.  Denies any fevers, leg swelling, chest pain, or difficulty breathing.  She does state that she is having some discomfort in her drain tube insertion sites.  She reports that they are functioning well, but she has only had approximately 10 cc output from each drain since time of surgery.  Will leave the drains in place over the weekend, but have her come in to clinic early next week for evaluation and possible removal.  Overall she states that she is doing well, all questions were answered.  She understands that she can call the clinic/on-call service should she have any questions or concerns over the weekend.

## 2023-03-02 NOTE — Anesthesia Postprocedure Evaluation (Signed)
Anesthesia Post Note  Patient: Ann Walters  Procedure(s) Performed: Removal of bilateral breast implants (Bilateral: Breast)     Patient location during evaluation: PACU Anesthesia Type: General Level of consciousness: awake Pain management: pain level controlled Vital Signs Assessment: post-procedure vital signs reviewed and stable Respiratory status: spontaneous breathing, nonlabored ventilation and respiratory function stable Cardiovascular status: blood pressure returned to baseline and stable Postop Assessment: no apparent nausea or vomiting Anesthetic complications: no   No notable events documented.  Last Vitals:  Vitals:   03/02/23 0925 03/02/23 1004  BP:  (!) 126/53  Pulse: 100 92  Resp: (!) 22 16  Temp:  36.6 C  SpO2: 97% 97%    Last Pain:  Vitals:   03/02/23 1004  TempSrc:   PainSc: 3                  Malone Admire P Sinthia Karabin

## 2023-03-02 NOTE — Anesthesia Preprocedure Evaluation (Addendum)
Anesthesia Evaluation  Patient identified by MRN, date of birth, ID band Patient awake    Reviewed: Allergy & Precautions, NPO status , Patient's Chart, lab work & pertinent test results  History of Anesthesia Complications (+) PONV and history of anesthetic complications  Airway Mallampati: II  TM Distance: >3 FB Neck ROM: Full    Dental no notable dental hx.    Pulmonary sleep apnea and Continuous Positive Airway Pressure Ventilation    Pulmonary exam normal        Cardiovascular hypertension, Pt. on medications Normal cardiovascular exam     Neuro/Psych  PSYCHIATRIC DISORDERS      negative neurological ROS     GI/Hepatic negative GI ROS, Neg liver ROS,,,  Endo/Other  negative endocrine ROS    Renal/GU negative Renal ROS     Musculoskeletal negative musculoskeletal ROS (+)    Abdominal  (+) + obese  Peds  Hematology negative hematology ROS (+)   Anesthesia Other Findings Ruptured breast implant  Reproductive/Obstetrics                             Anesthesia Physical Anesthesia Plan  ASA: 2  Anesthesia Plan: General   Post-op Pain Management:    Induction: Intravenous  PONV Risk Score and Plan: 3 and Ondansetron, Dexamethasone, Midazolam, Treatment may vary due to age or medical condition and Scopolamine patch - Pre-op  Airway Management Planned: LMA  Additional Equipment:   Intra-op Plan:   Post-operative Plan: Extubation in OR  Informed Consent: I have reviewed the patients History and Physical, chart, labs and discussed the procedure including the risks, benefits and alternatives for the proposed anesthesia with the patient or authorized representative who has indicated his/her understanding and acceptance.     Dental advisory given  Plan Discussed with: CRNA  Anesthesia Plan Comments:        Anesthesia Quick Evaluation

## 2023-03-02 NOTE — Discharge Instructions (Addendum)
INSTRUCTIONS FOR AFTER BREAST SURGERY   You will likely have some questions about what to expect following your operation.  The following information will help you and your family understand what to expect when you are discharged from the hospital.  It is important to follow these guidelines to help ensure a smooth recovery and reduce complication.  Postoperative instructions include information on: diet, wound care, medications and physical activity.  AFTER SURGERY Expect to go home after the procedure.  In some cases, you may need to spend one night in the hospital for observation.  DIET Breast surgery does not require a specific diet.  However, the healthier you eat the better your body will heal. It is important to increasing your protein intake.  This means limiting the foods with sugar and carbohydrates.  Focus on vegetables and some meat.  If you have liposuction during your procedure be sure to drink water.  If your urine is bright yellow, then it is concentrated, and you need to drink more water.  As a general rule after surgery, you should have 8 ounces of water every hour while awake.  If you find you are persistently nauseated or unable to take in liquids let us know.  NO TOBACCO USE or EXPOSURE.  This will slow your healing process and lead to a wound.  WOUND CARE Leave the binder on for 3 days . Use fragrance free soap like Dial, Dove or [place].   After 3 days you can remove the binder to shower. Once dry apply binder or sports bra. If you have liposuction you will have a soft and spongy dressing (Lipofoam) that helps prevent creases in your skin.  Remove before you shower and then replace it.  It is also available on [employer]. If you have steri-strips / tape directly attached to your skin leave them in place. It is OK to get these wet.   No baths, pools or hot tubs for four weeks. We close your incision to leave the smallest and best-looking scar. No ointment or creams on your incisions  for four weeks.  No Neosporin (Too many skin reactions).  A few weeks after surgery you can use Mederma and start massaging the scar. We ask you to wear your binder or sports bra for the first 6 weeks around the clock, including while sleeping. This provides added comfort and helps reduce the fluid accumulation at the surgery site. NO Ice or heating pads to the operative site.  You have a very high risk of a BURN before you feel the temperature change.  ACTIVITY No heavy lifting until cleared by the doctor.  This usually means no more than a half-gallon of milk.  It is OK to walk and climb stairs. Moving your legs is very important to decrease your risk of a blood clot.  It will also help keep you from getting deconditioned.  Every 1 to 2 hours get up and walk for 5 minutes. This will help with a quicker recovery back to normal.  Let pain be your guide so you don't do too much.  This time is for you to recover.  You will be more comfortable if you sleep and rest with your head elevated either with a few pillows under you or in a recliner.  No stomach sleeping for a three months.  WORK Everyone returns to work at different times. As a rough guide, most people take at least 1 - 2 weeks off prior to returning to work. If  you need documentation for your job, give the forms to the front staff at the clinic.  DRIVING Arrange for someone to bring you home from the hospital after your surgery.  You may be able to drive a few days after surgery but not while taking any narcotics or valium.  BOWEL MOVEMENTS Constipation can occur after anesthesia and while taking pain medication.  It is important to stay ahead for your comfort.  We recommend taking Milk of Magnesia (2 tablespoons; twice a day) while taking the pain pills.  MEDICATIONS You may be prescribed should start after surgery At your preoperative visit for you history and physical you may have been given the following medications: An antibiotic: Start  this medication when you get home and take according to the instructions on the bottle. Zofran 4 mg:  This is to treat nausea and vomiting.  You can take this every 6 hours as needed and only if needed. Valium 2 mg for breast cancer patients: This is for muscle tightness if you have an implant or expander. This will help relax your muscle which also helps with pain control.  This can be taken every 12 hours as needed. Don't drive after taking this medication. Norco (hydrocodone/acetaminophen) 5/325 mg:  This is only to be used after you have taken the Motrin or the Tylenol. Every 8 hours as needed.   Over the counter Medication to take: Ibuprofen (Motrin) 600 mg:  Take this every 6 hours.  If you have additional pain then take 500 mg of the Tylenol every 8 hours.  Only take the Norco after you have tried these two. MiraLAX or Milk of Magnesia: Take this according to the bottle if you take the Norco.  WHEN TO CALL Call your surgeon's office if any of the following occur: Fever 101 degrees F or greater Excessive bleeding or fluid from the incision site. Pain that increases over time without aid from the medications Redness, warmth, or pus draining from incision sites Persistent nausea or inability to take in liquids Severe misshapen area that underwent the operation.    Post Anesthesia Home Care Instructions  Activity: Get plenty of rest for the remainder of the day. A responsible individual must stay with you for 24 hours following the procedure.  For the next 24 hours, DO NOT: -Drive a car -Advertising copywriter -Drink alcoholic beverages -Take any medication unless instructed by your physician -Make any legal decisions or sign important papers.  Meals: Start with liquid foods such as gelatin or soup. Progress to regular foods as tolerated. Avoid greasy, spicy, heavy foods. If nausea and/or vomiting occur, drink only clear liquids until the nausea and/or vomiting subsides. Call your  physician if vomiting continues.  Special Instructions/Symptoms: Your throat may feel dry or sore from the anesthesia or the breathing tube placed in your throat during surgery. If this causes discomfort, gargle with warm salt water. The discomfort should disappear within 24 hours.  If you had a scopolamine patch placed behind your ear for the management of post- operative nausea and/or vomiting:  1. The medication in the patch is effective for 72 hours, after which it should be removed.  Wrap patch in a tissue and discard in the trash. Wash hands thoroughly with soap and water. 2. You may remove the patch earlier than 72 hours if you experience unpleasant side effects which may include dry mouth, dizziness or visual disturbances. 3. Avoid touching the patch. Wash your hands with soap and water after contact with  the patch.     May have Tylenol today after 1:00 PM  Information for Discharge Teaching: EXPAREL (bupivacaine liposome injectable suspension)   Your surgeon or anesthesiologist gave you EXPAREL(bupivacaine) to help control your pain after surgery.  EXPAREL is a local anesthetic that provides pain relief by numbing the tissue around the surgical site. EXPAREL is designed to release pain medication over time and can control pain for up to 72 hours. Depending on how you respond to EXPAREL, you may require less pain medication during your recovery.  Possible side effects: Temporary loss of sensation or ability to move in the area where bupivacaine was injected. Nausea, vomiting, constipation Rarely, numbness and tingling in your mouth or lips, lightheadedness, or anxiety may occur. Call your doctor right away if you think you may be experiencing any of these sensations, or if you have other questions regarding possible side effects.  Follow all other discharge instructions given to you by your surgeon or nurse. Eat a healthy diet and drink plenty of water or other fluids.  If you  return to the hospital for any reason within 96 hours following the administration of EXPAREL, it is important for health care providers to know that you have received this anesthetic. A teal colored band has been placed on your arm with the date, time and amount of EXPAREL you have received in order to alert and inform your health care providers. Please leave this armband in place for the full 96 hours following administration, and then you may remove the band.  About my Jackson-Pratt Bulb Drain  What is a Jackson-Pratt bulb? A Jackson-Pratt is a soft, round device used to collect drainage. It is connected to a long, thin drainage catheter, which is held in place by one or two small stiches near your surgical incision site. When the bulb is squeezed, it forms a vacuum, forcing the drainage to empty into the bulb.  Emptying the Jackson-Pratt bulb- To empty the bulb: 1. Release the plug on the top of the bulb. 2. Pour the bulb's contents into a measuring container which your nurse will provide. 3. Record the time emptied and amount of drainage. Empty the drain(s) as often as your     doctor or nurse recommends.  Date                  Time                    Amount (Drain 1)                 Amount (Drain 2)  _____________________________________________________________________  _____________________________________________________________________  _____________________________________________________________________  _____________________________________________________________________  _____________________________________________________________________  _____________________________________________________________________  _____________________________________________________________________  _____________________________________________________________________  Squeezing the Jackson-Pratt Bulb- To squeeze the bulb: 1. Make sure the plug at the top of the bulb is open. 2. Squeeze  the bulb tightly in your fist. You will hear air squeezing from the bulb. 3. Replace the plug while the bulb is squeezed. 4. Use a safety pin to attach the bulb to your clothing. This will keep the catheter from     pulling at the bulb insertion site.  When to call your doctor- Call your doctor if: Drain site becomes red, swollen or hot. You have a fever greater than 101 degrees F. There is oozing at the drain site. Drain falls out (apply a guaze bandage over the drain hole and secure it with tape). Drainage increases daily not related to activity patterns. (You will usually have  more drainage when you are active than when you are resting.) Drainage has a bad odor.

## 2023-03-02 NOTE — Transfer of Care (Signed)
Immediate Anesthesia Transfer of Care Note  Patient: Ann Walters  Procedure(s) Performed: Removal of bilateral breast implants (Bilateral: Breast)  Patient Location: PACU  Anesthesia Type:General  Level of Consciousness: drowsy, patient cooperative, and responds to stimulation  Airway & Oxygen Therapy: Patient Spontanous Breathing  Post-op Assessment: Report given to RN and Post -op Vital signs reviewed and stable  Post vital signs: Reviewed and stable  Last Vitals:  Vitals Value Taken Time  BP 142/73 03/02/23 0837  Temp    Pulse 113 03/02/23 0838  Resp 12 03/02/23 0838  SpO2 100 % 03/02/23 0838  Vitals shown include unvalidated device data.  Last Pain:  Vitals:   03/02/23 0635  TempSrc: Oral  PainSc: 0-No pain      Patients Stated Pain Goal: 3 (03/02/23 8295)  Complications: No notable events documented.

## 2023-03-02 NOTE — Op Note (Addendum)
DATE OF OPERATION: 03/02/2023  LOCATION: [facility]  PREOPERATIVE DIAGNOSIS: ruptured left breast implant  POSTOPERATIVE DIAGNOSIS: Same  PROCEDURE: Removal of bilateral breast implants  SURGEON: Alan Ripper Sanger Damiah Mcdonald, DO  ASSISTANT: Evelena Leyden, PA  EBL: none  CONDITION: Stable  COMPLICATIONS: None  INDICATION: The patient, Ann Walters, is a 62 y.o. female born on [birth year], is here for treatment of a left ruptured breast implant.   PROCEDURE DETAILS:  The patient was seen prior to surgery and marked.  The IV antibiotics were given. The patient was taken to the operating room and given a general anesthetic. A standard time out was performed and all information was confirmed by those in the room. SCDs were placed.   The patient was prepped and draped.    Right: The local was injected into the inframammary fold 3 cm previous incision site.  The #15 blade was used to open the previous incision.  The bovie was used to dissect to the capsule.  The capsule was freed from the surrounding breast tissue.  Hemostasis was achieved with electrocautery.  The entire capsule and implant was removed.  It was a 400 cc saline implant.  The Experel was injected into the pocket. A drain was placed and secured to the skin with the 3-0 Silk. The pocket was closed with the 3-0 PDS.  The 3-0 Monocryl was used to close the deep layer and the 4-0 Monocryl was used to close the skin.    Left:  The local was injected into the inframammary fold 3 cm previous incision site.  The #15 blade was used to open the previous incision.  The bovie was used to dissect to the capsule.  The capsule was freed from the surrounding breast tissue.  Hemostasis was achieved with electrocautery.  The entire capsule and implant was removed.  It was a deflated saline implant.  The drain was placed and secured to the skin with the 3-0 silk. The Experel was injected into the pocket.  The pocket was closed with the  3-0 PDS.  The 3-0 Monocryl was used to close the deep layer and the 4-0 Monocryl was used to close the skin.  Derma bond and steri strips were applied. The patient was allowed to wake up and taken to recovery room in stable condition at the end of the case. The family was notified at the end of the case.  The capsules were sent to pathology.  The advanced practice practitioner (APP) assisted throughout the case.  The APP was essential in retraction and counter traction when needed to make the case progress smoothly.  This retraction and assistance made it possible to see the tissue plans for the procedure.  The assistance was needed for blood control, tissue re-approximation and assisted with closure of the incision site.

## 2023-03-03 ENCOUNTER — Encounter (HOSPITAL_BASED_OUTPATIENT_CLINIC_OR_DEPARTMENT_OTHER): Payer: Self-pay | Admitting: Plastic Surgery

## 2023-03-03 LAB — SURGICAL PATHOLOGY

## 2023-03-04 ENCOUNTER — Ambulatory Visit (INDEPENDENT_AMBULATORY_CARE_PROVIDER_SITE_OTHER): Payer: 59 | Admitting: Physician Assistant

## 2023-03-04 DIAGNOSIS — Z9886 Personal history of breast implant removal: Secondary | ICD-10-CM

## 2023-03-08 ENCOUNTER — Ambulatory Visit (INDEPENDENT_AMBULATORY_CARE_PROVIDER_SITE_OTHER): Payer: 59 | Admitting: Physician Assistant

## 2023-03-08 VITALS — BP 112/63 | HR 111 | Temp 97.6°F | Wt 178.2 lb

## 2023-03-08 DIAGNOSIS — Z9886 Personal history of breast implant removal: Secondary | ICD-10-CM

## 2023-03-08 NOTE — Progress Notes (Signed)
Patient is a pleasant 62 year old female with PMH of mastectomy/lumpectomy with bilateral breast implant based reconstruction now s/p bilateral breast implant removal performed 03/02/2023 by Dr. Ulice Bold who presents to clinic for postoperative follow-up.  Spoke with patient on 03/04/2023 and at that time drain output was approximately 10 cc/day from each side.  Aside from the tube site insertion discomfort, she was otherwise doing quite well from a postoperative standpoint and plan was for in person evaluation early next week for consideration of drain removal.  Today, patient is doing well from a postoperative standpoint.  She has been having some discomfort at the drain tube insertion sites, but otherwise reports that she is doing well.  She denies any nausea and reports that she is tolerating p.o. intake without difficulty.  She is voiding, ambulatory.  She had some increased activity over the weekend which increased her volume output to the 20-50 range, but it has since subsided and has been less than 20 cc for the past couple of days.  She is hopeful that they can be removed here today.  Denies any fevers, shortness of breath, or leg swelling.  On exam, breasts appear healthy.  Soft throughout.  No obvious seroma or hematoma noted.  Bordered Mepilex dressings remain firmly in place.  Drains are intact and functional, normal-appearing output in bulbs bilaterally.  Less than 5 to 10 cc in each.  Drains removed without complication or difficulty.  Drain tube insertion sites appear healthy.  No erythema or cellulitis.  Discussed continued compressive garments and activity modifications.  She already has an appointment with Dr. Ulice Bold later this week.  Given her benign exam today, offered for her to cancel that appointment and rescheduled for a later date.  However, she would like to come in and speak with her as well, particularly if anything changes over the next few days.  Will defer photos until  subsequent counter.

## 2023-03-11 ENCOUNTER — Encounter: Payer: Self-pay | Admitting: Plastic Surgery

## 2023-03-11 ENCOUNTER — Ambulatory Visit (INDEPENDENT_AMBULATORY_CARE_PROVIDER_SITE_OTHER): Payer: 59 | Admitting: Plastic Surgery

## 2023-03-11 VITALS — BP 133/73 | HR 103 | Ht 63.0 in | Wt 179.0 lb

## 2023-03-11 DIAGNOSIS — Z719 Counseling, unspecified: Secondary | ICD-10-CM

## 2023-03-11 NOTE — Progress Notes (Signed)
The patient is a 62 year old female here for follow-up after undergoing removal of ruptured implants.  She had them removed on May 22.  So far she is doing really well.  There is a little swelling and bruising as expected but overall she is doing much better.  No sign of redness or infection.  Pictures were obtained of the patient and placed in the chart with the patient's or guardian's permission.

## 2023-03-17 ENCOUNTER — Telehealth: Payer: Self-pay | Admitting: Physician Assistant

## 2023-03-17 ENCOUNTER — Encounter: Payer: 59 | Admitting: Physician Assistant

## 2023-03-17 NOTE — Telephone Encounter (Signed)
If she has any concerns then she can come in sooner otherwise I will plan to see her on 6/28. Thanks

## 2023-03-17 NOTE — Telephone Encounter (Signed)
Patient called today and said she wont be able to make it to the appointment she has this afternoon, She said she wont be able to do next week either because she will be out of town. Her next post op is 04/08/23 and she wants to know if she still needs to be seen before then. Please advise. Call back is [phone].

## 2023-03-18 ENCOUNTER — Ambulatory Visit (INDEPENDENT_AMBULATORY_CARE_PROVIDER_SITE_OTHER): Payer: 59 | Admitting: Physician Assistant

## 2023-03-18 ENCOUNTER — Encounter: Payer: Self-pay | Admitting: Physician Assistant

## 2023-03-18 VITALS — BP 127/65 | HR 93

## 2023-03-18 DIAGNOSIS — Z9886 Personal history of breast implant removal: Secondary | ICD-10-CM

## 2023-03-18 NOTE — Telephone Encounter (Signed)
Well then she needs to be scheduled then on Monday 03/28/2023 since she is unavailable next week

## 2023-03-18 NOTE — Telephone Encounter (Signed)
I talked to Ann Walters and he said she needs to be seen Monday or today before he can clear her. I called patient and left message for patient to call back to schedule

## 2023-03-18 NOTE — Progress Notes (Signed)
Patient is a pleasant 62 year old female with PMH of mastectomy/lumpectomy with bilateral breast implant based reconstruction now s/p bilateral breast implant removal performed 03/02/2023 by Dr. Ulice Bold who presents to clinic for postoperative follow-up.   She was last seen here in clinic on 03/11/2023.  At that time, exam was entirely benign.    Today, patient is doing well.  She states that she had a little bit of a burning sensation when reaching overhead with the right arm that went away quickly.  She also states that she had a little bit of irritation and itching from the Steri-Strips that remained intact underneath the breasts.  Otherwise, no complaints.  Specifically, denies any fevers, chest pain, difficulty breathing, leg swelling, severe breast pain, or other symptoms.  On exam, Steri-Strips are removed without complication or difficulty.  Excision sites appear to be healing nicely.  Well-approximated, CDI.  No surrounding induration, drainage, or malodor.  No overlying skin changes.  She is on her way to the beach this weekend.  Informed patient that she cannot submerge the breasts/excision sites in water for at least another few weeks until the incisions are fully epithelialized.  Vaseline and/or gauze as needed.  Return as scheduled.  As for work, clear to return 03/28/2023 as completed per [facility] but with ongoing lifting restrictions for an additional 2 weeks.  Will provide letter that she can give to her employer.  Picture(s) obtained of the patient and placed in the chart were with the patient's or guardian's permission.

## 2023-03-22 ENCOUNTER — Encounter: Payer: 59 | Admitting: Physician Assistant

## 2023-04-08 ENCOUNTER — Ambulatory Visit (INDEPENDENT_AMBULATORY_CARE_PROVIDER_SITE_OTHER): Payer: 59 | Admitting: Physician Assistant

## 2023-04-08 ENCOUNTER — Encounter: Payer: Self-pay | Admitting: Physician Assistant

## 2023-04-08 VITALS — BP 107/66 | HR 109

## 2023-04-08 DIAGNOSIS — Z9886 Personal history of breast implant removal: Secondary | ICD-10-CM

## 2023-04-08 NOTE — Progress Notes (Signed)
Patient is a pleasant 62 year old female with PMH of mastectomy/lumpectomy with bilateral breast implant based reconstruction now s/p bilateral breast implant removal performed 03/02/2023 by Dr. Ulice Bold who presents to clinic for postoperative follow-up.   She was last seen here in clinic on 03/18/2023.  At that time, she endorsed burning sensation when reaching overhead with right arm.  Exam was entirely benign.  Discussed ongoing activity modifications and compressive garments.  Follow-up as scheduled.  Today, she is doing well from a pain standpoint.  She believes that she may have a protruding suture underneath her left breast implant removal site, but otherwise no complaints of discomfort.  She has had some issues with her [employer] short-term disability but is working towards resolving that matter.  She did have a moment of frustration last week while brought shopping.  She states that she has an upcoming wedding that she is attending and that she is having difficulty finding a bra that fits.  She suspects that this is related to her previous right-sided partial mastectomy/lumpectomy.  On exam, breasts appear to have relatively good symmetry.  She does have some right breast volume loss superiorly, likely related to her partial mastectomy/lumpectomy in that area.  Similar ptosis.  NAC's are healthy.  The implant excision sites appear to be well-healed bilaterally.  There is a small protrusion underneath the skin medially on the left side.  Will not attempt removal.  No surrounding skin changes.  Nontender.  Recommend gentle massage of the small protrusion on the left implant removal site.  Suspect that it is a buried suture that has not yet absorbed.  She has been applying silicone scar gels regularly, recommend that she continue to apply twice daily for 2 more months.  Will provide referral to [facility] here in [facility] or [facility] [employer] [facility] in [place], [place] which is  easier for patient.  Patient to call should she have any additional questions or concerns, but otherwise she is cleared from a postoperative standpoint.  She will likely follow-up with the clinic in 6 months for consideration of mastopexy which she was holding off on until she had additional weight loss.  She wants to continue with her weight loss journey in interim.

## 2023-04-12 ENCOUNTER — Telehealth: Payer: Self-pay | Admitting: Plastic Surgery

## 2023-04-12 NOTE — Telephone Encounter (Signed)
Script for a bra, [employer], pt is thre and they told her they have not received anything fax [phone]

## 2023-04-12 NOTE — Telephone Encounter (Signed)
I believe [place] had reached out to them already? Cc'd you [place]

## 2023-04-12 NOTE — Telephone Encounter (Signed)
Faxed demographics, insurance card and most recent ov note to [facility] at [phone].

## 2023-10-27 ENCOUNTER — Ambulatory Visit
Admission: EM | Admit: 2023-10-27 | Discharge: 2023-10-27 | Disposition: A | Discharge status: Home / Self Care | Payer: 59 | Attending: Family Medicine | Admitting: Family Medicine

## 2023-10-27 DIAGNOSIS — M778 Other enthesopathies, not elsewhere classified: Secondary | ICD-10-CM | POA: Diagnosis not present

## 2023-10-27 MED ORDER — TIZANIDINE HCL 4 MG PO CAPS: 4.0000 mg | ORAL_CAPSULE | Freq: Three times a day (TID) | ORAL | 0 refills | Status: AC | PRN

## 2023-10-27 MED ORDER — PREDNISONE 20 MG PO TABS: 40.0000 mg | ORAL_TABLET | Freq: Every day | ORAL | 0 refills | Status: AC

## 2023-10-27 NOTE — Discharge Instructions (Signed)
In addition to the prescribed medications, you may use a tennis elbow brace, heat, massage, stretches, Kinesiotape.  Follow-up with orthopedics if not resolving

## 2023-10-27 NOTE — ED Triage Notes (Signed)
Pt reports she hit her left elbows x 2 months and has been having pain when applying pressure to her elbow

## 2023-10-27 NOTE — ED Provider Notes (Signed)
[facility]    CSN: [id number] Arrival date & time: 10/27/23  1821      History   Chief Complaint No chief complaint on file.   HPI Ann Walters is a 63 y.o. female.   Presenting today with about 2 months of progressively worsening pain to the left elbow.  States she hit the elbow on a metal door at onset of symptoms and initially had some burning stinging pain to the area but this overall resolved.  She is now having pain with movement, particular rotation of the forearm mainly to the lateral elbow.  Denies significant redness, swelling, numbness, tingling, loss of range of motion.  Trying over-the-counter remedies with no relief.  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    Past Medical History:  Diagnosis Date   Hypertension    PONV (postoperative nausea and vomiting)    Pre-diabetes    Sleep apnea     Patient Active Problem List   Diagnosis Date Noted   Encounter for counseling 01/06/2023    Past Surgical History:  Procedure Laterality Date   ABDOMINAL HYSTERECTOMY     ANKLE SURGERY     AUGMENTATION MAMMAPLASTY Bilateral 2011   BREAST ENHANCEMENT SURGERY     BREAST EXCISIONAL BIOPSY Right    BREAST IMPLANT REMOVAL Bilateral 03/02/2023   Procedure: Removal of bilateral breast implants;  Surgeon: Peggye Form, DO;  Location: [facility];  Service: Plastics;  Laterality: Bilateral;   COMBINED ABDOMINOPLASTY AND LIPOSUCTION     SHOULDER SURGERY      OB History   No obstetric history on file.      Home Medications    Prior to Admission medications   Medication Sig Start Date End Date Taking? Authorizing Provider  predniSONE (DELTASONE) 20 MG tablet Take 2 tablets (40 mg total) by mouth daily with breakfast. 10/27/23  Yes Particia Nearing, PA-C  tiZANidine (ZANAFLEX) 4 MG capsule Take 1 capsule (4 mg total) by mouth 3 (three) times daily as needed for muscle spasms. Do not drink alcohol or drive while taking this  medication.  May cause drowsiness. 10/27/23  Yes Particia Nearing, PA-C  cholecalciferol (VITAMIN D) 1000 UNITS tablet Take 2,000 Units by mouth every morning.     [provider]  Multiple Vitamins-Minerals (MULTIVITAMIN WITH MINERALS) tablet Take 1 tablet by mouth every morning.     [provider]  telmisartan (MICARDIS) 80 MG tablet Take 80 mg by mouth daily.    [provider]  venlafaxine XR (EFFEXOR-XR) 150 MG 24 hr capsule Take 150 mg by mouth daily.    [provider]    Family History Family History  Problem Relation Age of Onset   Breast cancer Neg Hx     Social History Social History   Tobacco Use   Smoking status: Never  Substance Use Topics   Alcohol use: Yes    Comment: occasional   Drug use: No     Allergies   Sulfa antibiotics and Ampicillin   Review of Systems Review of Systems Per HPI  Physical Exam Triage Vital Signs ED Triage Vitals  Encounter Vitals Group     BP 10/27/23 1828 135/80     Systolic BP Percentile --      Diastolic BP Percentile --      Pulse Rate 10/27/23 1828 96     Resp 10/27/23 1828 16     Temp 10/27/23 1828 98.4 F (36.9 C)  Temp Source 10/27/23 1828 Oral     SpO2 10/27/23 1828 95 %     Weight --      Height --      Head Circumference --      Peak Flow --      Pain Score 10/27/23 1827 5     Pain Loc --      Pain Education --      Exclude from Growth Chart --    No data found.  Updated Vital Signs BP 135/80 (BP Location: Right Arm)   Pulse 96   Temp 98.4 F (36.9 C) (Oral)   Resp 16   SpO2 95%   Visual Acuity Right Eye Distance:   Left Eye Distance:   Bilateral Distance:    Right Eye Near:   Left Eye Near:    Bilateral Near:     Physical Exam Vitals and nursing note reviewed.  Constitutional:      Appearance: Normal appearance. She is not ill-appearing.  HENT:     Head: Atraumatic.  Eyes:     Extraocular Movements: Extraocular movements intact.      Conjunctiva/sclera: Conjunctivae normal.  Cardiovascular:     Rate and Rhythm: Normal rate and regular rhythm.     Heart sounds: Normal heart sounds.  Pulmonary:     Effort: Pulmonary effort is normal.     Breath sounds: Normal breath sounds.  Musculoskeletal:        General: Tenderness and signs of injury present. No swelling or deformity. Normal range of motion.     Cervical back: Normal range of motion and neck supple.     Comments: Tender to palpation to the lateral left elbow worse with movement.  Grip strength mildly decreased to the left hand  Skin:    General: Skin is warm and dry.     Findings: No bruising or erythema.  Neurological:     Mental Status: She is alert and oriented to person, place, and time.     Motor: No weakness.     Gait: Gait normal.     Comments: Left upper extremity neurovascularly intact  Psychiatric:        Mood and Affect: Mood normal.        Thought Content: Thought content normal.        Judgment: Judgment normal.      UC Treatments / Results  Labs (all labs ordered are listed, but only abnormal results are displayed) Labs Reviewed - No data to display  EKG   Radiology No results found.  Procedures Procedures (including critical care time)  Medications Ordered in UC Medications - No data to display  Initial Impression / Assessment and Plan / UC Course  I have reviewed the triage vital signs and the nursing notes.  Pertinent labs & imaging results that were available during my care of the patient were reviewed by me and considered in my medical decision making (see chart for details).     Suspect tendinitis of the elbow.  Treat with prednisone, Zanaflex, heat, massage, stretches and Ortho follow-up if not resolving. Imaging deferred today with shared decision making.  Final Clinical Impressions(s) / UC Diagnoses   Final diagnoses:  Elbow tendonitis     Discharge Instructions      In addition to the prescribed  medications, you may use a tennis elbow brace, heat, massage, stretches, Kinesiotape.  Follow-up with orthopedics if not resolving    ED Prescriptions     Medication Sig Dispense  Auth. Provider   predniSONE (DELTASONE) 20 MG tablet Take 2 tablets (40 mg total) by mouth daily with breakfast. 10 tablet Particia Nearing, PA-C   tiZANidine (ZANAFLEX) 4 MG capsule Take 1 capsule (4 mg total) by mouth 3 (three) times daily as needed for muscle spasms. Do not drink alcohol or drive while taking this medication.  May cause drowsiness. 15 capsule Particia Nearing, [place]      PDMP not reviewed this encounter.   Particia Nearing, [place] 10/27/23 1916

## 2024-01-16 ENCOUNTER — Encounter: Payer: Self-pay | Admitting: Dermatology

## 2024-01-16 ENCOUNTER — Ambulatory Visit: Payer: 59 | Admitting: Dermatology

## 2024-01-16 DIAGNOSIS — T22212A Burn of second degree of left forearm, initial encounter: Secondary | ICD-10-CM

## 2024-01-16 DIAGNOSIS — B351 Tinea unguium: Secondary | ICD-10-CM

## 2024-01-16 DIAGNOSIS — T2220XA Burn of second degree of shoulder and upper limb, except wrist and hand, unspecified site, initial encounter: Secondary | ICD-10-CM

## 2024-01-16 DIAGNOSIS — L821 Other seborrheic keratosis: Secondary | ICD-10-CM

## 2024-01-16 DIAGNOSIS — L918 Other hypertrophic disorders of the skin: Secondary | ICD-10-CM

## 2024-01-16 DIAGNOSIS — W908XXA Exposure to other nonionizing radiation, initial encounter: Secondary | ICD-10-CM

## 2024-01-16 DIAGNOSIS — Z1283 Encounter for screening for malignant neoplasm of skin: Secondary | ICD-10-CM | POA: Diagnosis not present

## 2024-01-16 DIAGNOSIS — L814 Other melanin hyperpigmentation: Secondary | ICD-10-CM | POA: Diagnosis not present

## 2024-01-16 DIAGNOSIS — L817 Pigmented purpuric dermatosis: Secondary | ICD-10-CM

## 2024-01-16 DIAGNOSIS — D1801 Hemangioma of skin and subcutaneous tissue: Secondary | ICD-10-CM | POA: Diagnosis not present

## 2024-01-16 DIAGNOSIS — L578 Other skin changes due to chronic exposure to nonionizing radiation: Secondary | ICD-10-CM

## 2024-01-16 DIAGNOSIS — D492 Neoplasm of unspecified behavior of bone, soft tissue, and skin: Secondary | ICD-10-CM | POA: Diagnosis not present

## 2024-01-16 DIAGNOSIS — T22251A Burn of second degree of right shoulder, initial encounter: Secondary | ICD-10-CM

## 2024-01-16 DIAGNOSIS — D229 Melanocytic nevi, unspecified: Secondary | ICD-10-CM

## 2024-01-16 MED ORDER — JUBLIA 10 % EX SOLN: CUTANEOUS | 11 refills | Status: AC
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
## 2024-01-16 NOTE — Patient Instructions (Addendum)
 Patient Handout: Wound Care for Skin Biopsy Site  Taking Care of Your Skin Biopsy Site  Proper care of the biopsy site is essential for promoting healing and minimizing scarring. This handout provides instructions on how to care for your biopsy site to ensure optimal recovery.  1. Cleaning the Wound:  Clean the biopsy site daily with gentle soap and water. Gently pat the area dry with a clean, soft towel. Avoid harsh scrubbing or rubbing the area, as this can irritate the skin and delay healing.  2. Applying Aquaphor and Bandage:  After cleaning the wound, apply a thin layer of Aquaphor ointment to the biopsy site. Cover the area with a sterile bandage to protect it from dirt, bacteria, and friction. Change the bandage daily or as needed if it becomes soiled or wet.  3. Continued Care for One Week:  Repeat the cleaning, Aquaphor application, and bandaging process daily for one week following the biopsy procedure. Keeping the wound clean and moist during this initial healing period will help prevent infection and promote optimal healing.  4. Massaging Aquaphor into the Area:  ---After one week, discontinue the use of bandages but continue to apply Aquaphor to the biopsy site. ----Gently massage the Aquaphor into the area using circular motions. ---Massaging the skin helps to promote circulation and prevent the formation of scar tissue.   Additional Tips:  Avoid exposing the biopsy site to direct sunlight during the healing process, as this can cause hyperpigmentation or worsen scarring. If you experience any signs of infection, such as increased redness, swelling, warmth, or drainage from the wound, contact your healthcare provider immediately. Follow any additional instructions provided by your healthcare provider for caring for the biopsy site and managing any discomfort. Conclusion:  Taking proper care of your skin biopsy site is crucial for ensuring optimal healing and  minimizing scarring. By following these instructions for cleaning, applying Aquaphor, and massaging the area, you can promote a smooth and successful recovery. If you have any questions or concerns about caring for your biopsy site, don't hesitate to contact your healthcare provider for guidance.    Start Jublia nightly to affected toenails.  Your prescription was sent to [facility] in [place]. A representative from [employer] will contact you within 2 business hours to verify your address and insurance information to schedule a free delivery. If for any reason you do not receive a phone call from them, please reach out to them. Their phone number is [phone] and their hours are Monday-Friday 9:00 am-5:00 pm.       Skin Education :   I counseled the patient regarding the following: Sun screen (SPF 30 or greater) should be applied during peak UV exposure (between 10am and 2pm) and reapplied after exercise or swimming.  The ABCDEs of melanoma were reviewed with the patient, and the importance of monthly self-examination of moles was emphasized. Should any moles change in shape or color, or itch, bleed or burn, pt will contact our office for evaluation sooner then their interval appointment.  Plan: Sunscreen Recommendations I recommended a broad spectrum sunscreen with a SPF of 30 or higher. I explained that SPF 30 sunscreens block approximately 97 percent of the sun's harmful rays. Sunscreens should be applied at least 15 minutes prior to expected sun exposure and then every 2 hours after that as long as sun exposure continues. If swimming or exercising sunscreen should be reapplied every 45 minutes to an hour after getting wet or sweating. One ounce, or  the equivalent of a shot glass full of sunscreen, is adequate to protect the skin not covered by a bathing suit. I also recommended a lip balm with a sunscreen as well. Sun protective clothing can be used in lieu of sunscreen but  must be worn the entire time you are exposed to the sun's rays.    Important Information  Due to recent changes in healthcare laws, you may see results of your pathology and/or laboratory studies on MyChart before the doctors have had a chance to review them. We understand that in some cases there may be results that are confusing or concerning to you. Please understand that not all results are received at the same time and often the doctors may need to interpret multiple results in order to provide you with the best plan of care or course of treatment. Therefore, we ask that you please give [place] 2 business days to thoroughly review all your results before contacting the office for clarification. Should we see a critical lab result, you will be contacted sooner.   If You Need Anything After Your Visit  If you have any questions or concerns for your doctor, please call our main line at [phone] If no one answers, please leave a voicemail as directed and we will return your call as soon as possible. Messages left after 4 pm will be answered the following business day.   You may also send [place] a message via MyChart. We typically respond to MyChart messages within 1-2 business days.  For prescription refills, please ask your pharmacy to contact our office. Our fax number is [phone].  If you have an urgent issue when the clinic is closed that cannot wait until the next business day, you can page your doctor at the number below.    Please note that while we do our best to be available for urgent issues outside of office hours, we are not available 24/7.   If you have an urgent issue and are unable to reach [place], you may choose to seek medical care at your doctor's office, retail clinic, urgent care center, or emergency room.  If you have a medical emergency, please immediately call 911 or go to the emergency department. In the event of inclement weather, please call our main line at [phone]  for an update on the status of any delays or closures.  Dermatology Medication Tips: Please keep the boxes that topical medications come in in order to help keep track of the instructions about where and how to use these. Pharmacies typically print the medication instructions only on the boxes and not directly on the medication tubes.   If your medication is too expensive, please contact our office at [phone] or send [place] a message through MyChart.   We are unable to tell what your co-pay for medications will be in advance as this is different depending on your insurance coverage. However, we may be able to find a substitute medication at lower cost or fill out paperwork to get insurance to cover a needed medication.   If a prior authorization is required to get your medication covered by your insurance company, please allow [place] 1-2 business days to complete this process.  Drug prices often vary depending on where the prescription is filled and some pharmacies may offer cheaper prices.  The website www.goodrx.com contains coupons for medications through different pharmacies. The prices here do not account for what the cost may be with help from insurance (it may  be cheaper with your insurance), but the website can give you the price if you did not use any insurance.  - You can print the associated coupon and take it with your prescription to the pharmacy.  - You may also stop by our office during regular business hours and pick up a GoodRx coupon card.  - If you need your prescription sent electronically to a different pharmacy, notify our office through [facility] or by phone at [phone]

## 2024-01-18 LAB — SURGICAL PATHOLOGY

## 2024-01-19 ENCOUNTER — Encounter: Payer: Self-pay | Admitting: Dermatology

## 2024-02-13 NOTE — Telephone Encounter (Signed)
 Can you see the PA in covermymeds?  If not then change to [facility] pharmacy.  There out of pocket not covered price is $45
